# Patient Record
Sex: Female | Born: 1935 | Smoking: Never smoker
Health system: Southern US, Community
[De-identification: ages and names within clinical notes are randomized; demographics above are authoritative.]

## PROBLEM LIST (undated history)

## (undated) DIAGNOSIS — I10 Essential (primary) hypertension: Secondary | ICD-10-CM

## (undated) DIAGNOSIS — I219 Acute myocardial infarction, unspecified: Secondary | ICD-10-CM

## (undated) DIAGNOSIS — E119 Type 2 diabetes mellitus without complications: Secondary | ICD-10-CM

## (undated) DIAGNOSIS — S22080A Wedge compression fracture of T11-T12 vertebra, initial encounter for closed fracture: Secondary | ICD-10-CM

## (undated) DIAGNOSIS — M1710 Unilateral primary osteoarthritis, unspecified knee: Secondary | ICD-10-CM

## (undated) DIAGNOSIS — I272 Pulmonary hypertension, unspecified: Secondary | ICD-10-CM

## (undated) DIAGNOSIS — K219 Gastro-esophageal reflux disease without esophagitis: Secondary | ICD-10-CM

## (undated) DIAGNOSIS — I2510 Atherosclerotic heart disease of native coronary artery without angina pectoris: Secondary | ICD-10-CM

## (undated) DIAGNOSIS — M179 Osteoarthritis of knee, unspecified: Secondary | ICD-10-CM

## (undated) HISTORY — PX: STENT PLACEMENT ILIAC (ARMC HX): HXRAD1735

---

## 2003-11-17 ENCOUNTER — Other Ambulatory Visit: Payer: Self-pay

## 2003-11-18 ENCOUNTER — Other Ambulatory Visit: Payer: Self-pay

## 2004-07-01 ENCOUNTER — Inpatient Hospital Stay: Payer: Self-pay | Admitting: Internal Medicine

## 2006-06-08 ENCOUNTER — Ambulatory Visit: Payer: Self-pay | Admitting: Family Medicine

## 2006-07-05 ENCOUNTER — Ambulatory Visit: Payer: Self-pay | Admitting: Oncology

## 2006-07-10 ENCOUNTER — Ambulatory Visit: Payer: Self-pay | Admitting: Oncology

## 2006-07-20 ENCOUNTER — Ambulatory Visit: Payer: Self-pay | Admitting: Oncology

## 2006-08-09 ENCOUNTER — Ambulatory Visit: Payer: Self-pay | Admitting: Oncology

## 2006-12-10 ENCOUNTER — Ambulatory Visit: Payer: Self-pay | Admitting: Oncology

## 2007-01-02 ENCOUNTER — Ambulatory Visit: Payer: Self-pay | Admitting: Oncology

## 2007-01-09 ENCOUNTER — Ambulatory Visit: Payer: Self-pay | Admitting: Oncology

## 2007-01-10 ENCOUNTER — Ambulatory Visit: Payer: Self-pay | Admitting: Oncology

## 2007-02-09 ENCOUNTER — Ambulatory Visit: Payer: Self-pay | Admitting: Oncology

## 2007-03-11 ENCOUNTER — Ambulatory Visit: Payer: Self-pay | Admitting: Oncology

## 2007-03-21 ENCOUNTER — Ambulatory Visit: Payer: Self-pay | Admitting: Oncology

## 2007-04-11 ENCOUNTER — Ambulatory Visit: Payer: Self-pay | Admitting: Oncology

## 2007-05-12 ENCOUNTER — Ambulatory Visit: Payer: Self-pay | Admitting: Oncology

## 2007-06-09 ENCOUNTER — Ambulatory Visit: Payer: Self-pay | Admitting: Oncology

## 2007-07-10 ENCOUNTER — Ambulatory Visit: Payer: Self-pay | Admitting: Oncology

## 2007-08-09 ENCOUNTER — Ambulatory Visit: Payer: Self-pay | Admitting: Oncology

## 2007-10-09 ENCOUNTER — Ambulatory Visit: Payer: Self-pay | Admitting: Oncology

## 2007-10-28 ENCOUNTER — Ambulatory Visit: Payer: Self-pay | Admitting: Oncology

## 2007-11-09 ENCOUNTER — Ambulatory Visit: Payer: Self-pay | Admitting: Oncology

## 2007-12-10 ENCOUNTER — Ambulatory Visit: Payer: Self-pay | Admitting: Oncology

## 2008-02-09 ENCOUNTER — Ambulatory Visit: Payer: Self-pay | Admitting: Oncology

## 2008-02-24 ENCOUNTER — Ambulatory Visit: Payer: Self-pay | Admitting: Oncology

## 2008-03-10 ENCOUNTER — Ambulatory Visit: Payer: Self-pay | Admitting: Oncology

## 2008-04-10 ENCOUNTER — Ambulatory Visit: Payer: Self-pay | Admitting: Oncology

## 2008-04-21 ENCOUNTER — Ambulatory Visit: Payer: Self-pay | Admitting: Oncology

## 2008-05-11 ENCOUNTER — Ambulatory Visit: Payer: Self-pay | Admitting: Oncology

## 2008-07-09 ENCOUNTER — Ambulatory Visit: Payer: Self-pay | Admitting: Oncology

## 2008-07-14 ENCOUNTER — Ambulatory Visit: Payer: Self-pay | Admitting: Oncology

## 2008-08-08 ENCOUNTER — Ambulatory Visit: Payer: Self-pay

## 2008-08-08 ENCOUNTER — Ambulatory Visit: Payer: Self-pay | Admitting: Oncology

## 2008-10-08 ENCOUNTER — Ambulatory Visit: Payer: Self-pay | Admitting: Oncology

## 2008-10-27 ENCOUNTER — Ambulatory Visit: Payer: Self-pay | Admitting: Oncology

## 2008-11-08 ENCOUNTER — Ambulatory Visit: Payer: Self-pay | Admitting: Oncology

## 2009-06-08 ENCOUNTER — Ambulatory Visit: Payer: Self-pay | Admitting: Oncology

## 2009-06-15 ENCOUNTER — Ambulatory Visit: Payer: Self-pay | Admitting: Oncology

## 2009-06-17 ENCOUNTER — Ambulatory Visit: Payer: Self-pay | Admitting: Internal Medicine

## 2009-07-09 ENCOUNTER — Ambulatory Visit: Payer: Self-pay | Admitting: Oncology

## 2009-07-20 ENCOUNTER — Ambulatory Visit: Payer: Self-pay | Admitting: Oncology

## 2009-08-08 ENCOUNTER — Ambulatory Visit: Payer: Self-pay | Admitting: Oncology

## 2009-12-09 ENCOUNTER — Ambulatory Visit: Payer: Self-pay | Admitting: Oncology

## 2010-01-05 ENCOUNTER — Ambulatory Visit: Payer: Self-pay | Admitting: Oncology

## 2010-01-08 ENCOUNTER — Ambulatory Visit: Payer: Self-pay | Admitting: Oncology

## 2010-02-11 ENCOUNTER — Ambulatory Visit: Payer: Self-pay | Admitting: Oncology

## 2010-03-10 ENCOUNTER — Ambulatory Visit: Payer: Self-pay | Admitting: Oncology

## 2010-04-10 ENCOUNTER — Ambulatory Visit: Payer: Self-pay | Admitting: Oncology

## 2010-07-13 ENCOUNTER — Ambulatory Visit: Payer: Self-pay | Admitting: Oncology

## 2010-08-09 ENCOUNTER — Ambulatory Visit: Payer: Self-pay | Admitting: Oncology

## 2010-09-13 ENCOUNTER — Ambulatory Visit: Payer: Self-pay | Admitting: Oncology

## 2010-10-09 ENCOUNTER — Ambulatory Visit: Payer: Self-pay | Admitting: Oncology

## 2010-10-11 ENCOUNTER — Ambulatory Visit: Payer: Self-pay | Admitting: Oncology

## 2010-10-21 ENCOUNTER — Emergency Department: Payer: Self-pay | Admitting: Unknown Physician Specialty

## 2010-11-09 ENCOUNTER — Ambulatory Visit: Payer: Self-pay | Admitting: Oncology

## 2010-12-28 ENCOUNTER — Ambulatory Visit: Payer: Self-pay | Admitting: Oncology

## 2011-01-09 ENCOUNTER — Ambulatory Visit: Payer: Self-pay | Admitting: Oncology

## 2011-02-09 ENCOUNTER — Ambulatory Visit: Payer: Self-pay | Admitting: Oncology

## 2011-07-05 ENCOUNTER — Ambulatory Visit: Payer: Self-pay | Admitting: Internal Medicine

## 2011-07-05 DIAGNOSIS — M159 Polyosteoarthritis, unspecified: Secondary | ICD-10-CM | POA: Diagnosis not present

## 2011-07-05 DIAGNOSIS — I2510 Atherosclerotic heart disease of native coronary artery without angina pectoris: Secondary | ICD-10-CM | POA: Diagnosis not present

## 2011-07-05 DIAGNOSIS — M2560 Stiffness of unspecified joint, not elsewhere classified: Secondary | ICD-10-CM | POA: Diagnosis not present

## 2011-07-05 DIAGNOSIS — M25519 Pain in unspecified shoulder: Secondary | ICD-10-CM | POA: Diagnosis not present

## 2011-07-05 DIAGNOSIS — D638 Anemia in other chronic diseases classified elsewhere: Secondary | ICD-10-CM | POA: Diagnosis not present

## 2011-07-05 DIAGNOSIS — I10 Essential (primary) hypertension: Secondary | ICD-10-CM | POA: Diagnosis not present

## 2011-07-05 DIAGNOSIS — M25539 Pain in unspecified wrist: Secondary | ICD-10-CM | POA: Diagnosis not present

## 2011-07-05 DIAGNOSIS — M79609 Pain in unspecified limb: Secondary | ICD-10-CM | POA: Diagnosis not present

## 2011-07-05 DIAGNOSIS — E782 Mixed hyperlipidemia: Secondary | ICD-10-CM | POA: Diagnosis not present

## 2011-07-14 ENCOUNTER — Ambulatory Visit: Payer: Self-pay | Admitting: Oncology

## 2011-07-14 DIAGNOSIS — Z95818 Presence of other cardiac implants and grafts: Secondary | ICD-10-CM | POA: Diagnosis not present

## 2011-07-14 DIAGNOSIS — E538 Deficiency of other specified B group vitamins: Secondary | ICD-10-CM | POA: Diagnosis not present

## 2011-07-14 DIAGNOSIS — Z7982 Long term (current) use of aspirin: Secondary | ICD-10-CM | POA: Diagnosis not present

## 2011-07-14 DIAGNOSIS — E119 Type 2 diabetes mellitus without complications: Secondary | ICD-10-CM | POA: Diagnosis not present

## 2011-07-14 DIAGNOSIS — I2510 Atherosclerotic heart disease of native coronary artery without angina pectoris: Secondary | ICD-10-CM | POA: Diagnosis not present

## 2011-07-14 DIAGNOSIS — D509 Iron deficiency anemia, unspecified: Secondary | ICD-10-CM | POA: Diagnosis not present

## 2011-07-14 DIAGNOSIS — R609 Edema, unspecified: Secondary | ICD-10-CM | POA: Diagnosis not present

## 2011-07-14 DIAGNOSIS — I509 Heart failure, unspecified: Secondary | ICD-10-CM | POA: Diagnosis not present

## 2011-07-14 DIAGNOSIS — R5381 Other malaise: Secondary | ICD-10-CM | POA: Diagnosis not present

## 2011-07-14 DIAGNOSIS — R0602 Shortness of breath: Secondary | ICD-10-CM | POA: Diagnosis not present

## 2011-07-14 DIAGNOSIS — Z888 Allergy status to other drugs, medicaments and biological substances status: Secondary | ICD-10-CM | POA: Diagnosis not present

## 2011-07-14 DIAGNOSIS — Z79899 Other long term (current) drug therapy: Secondary | ICD-10-CM | POA: Diagnosis not present

## 2011-07-14 LAB — CBC CANCER CENTER
Basophil #: 0 x10 3/mm (ref 0.0–0.1)
HCT: 35.1 % (ref 35.0–47.0)
MCH: 30.4 pg (ref 26.0–34.0)
Monocyte #: 0.3 x10 3/mm (ref 0.0–0.7)
Neutrophil #: 2.4 x10 3/mm (ref 1.4–6.5)
Platelet: 219 x10 3/mm (ref 150–440)
RDW: 13.7 % (ref 11.5–14.5)
WBC: 3.9 x10 3/mm (ref 3.6–11.0)

## 2011-07-18 DIAGNOSIS — I82409 Acute embolism and thrombosis of unspecified deep veins of unspecified lower extremity: Secondary | ICD-10-CM | POA: Diagnosis not present

## 2011-07-20 DIAGNOSIS — M7530 Calcific tendinitis of unspecified shoulder: Secondary | ICD-10-CM | POA: Diagnosis not present

## 2011-07-20 DIAGNOSIS — L919 Hypertrophic disorder of the skin, unspecified: Secondary | ICD-10-CM | POA: Diagnosis not present

## 2011-07-20 DIAGNOSIS — L909 Atrophic disorder of skin, unspecified: Secondary | ICD-10-CM | POA: Diagnosis not present

## 2011-07-20 DIAGNOSIS — I10 Essential (primary) hypertension: Secondary | ICD-10-CM | POA: Diagnosis not present

## 2011-08-03 ENCOUNTER — Emergency Department: Payer: Self-pay | Admitting: Emergency Medicine

## 2011-08-03 DIAGNOSIS — R197 Diarrhea, unspecified: Secondary | ICD-10-CM | POA: Diagnosis not present

## 2011-08-03 DIAGNOSIS — R110 Nausea: Secondary | ICD-10-CM | POA: Diagnosis not present

## 2011-08-03 DIAGNOSIS — R6889 Other general symptoms and signs: Secondary | ICD-10-CM | POA: Diagnosis not present

## 2011-08-03 DIAGNOSIS — R1110 Vomiting, unspecified: Secondary | ICD-10-CM | POA: Diagnosis not present

## 2011-08-03 DIAGNOSIS — R1031 Right lower quadrant pain: Secondary | ICD-10-CM | POA: Diagnosis not present

## 2011-08-03 LAB — CBC WITH DIFFERENTIAL/PLATELET
Eosinophil #: 0 10*3/uL (ref 0.0–0.7)
Eosinophil %: 0.2 %
HGB: 12.8 g/dL (ref 12.0–16.0)
Lymphocyte #: 0.3 10*3/uL — ABNORMAL LOW (ref 1.0–3.6)
MCH: 30.6 pg (ref 26.0–34.0)
MCV: 94 fL (ref 80–100)
Monocyte #: 0.5 x10 3/mm (ref 0.2–0.9)
Monocyte %: 5.1 %
Neutrophil #: 7.8 10*3/uL — ABNORMAL HIGH (ref 1.4–6.5)
RDW: 14.7 % — ABNORMAL HIGH (ref 11.5–14.5)
WBC: 8.8 10*3/uL (ref 3.6–11.0)

## 2011-08-03 LAB — COMPREHENSIVE METABOLIC PANEL
Anion Gap: 11 (ref 7–16)
Bilirubin,Total: 0.6 mg/dL (ref 0.2–1.0)
Chloride: 101 mmol/L (ref 98–107)
Co2: 25 mmol/L (ref 21–32)
Creatinine: 0.86 mg/dL (ref 0.60–1.30)
Potassium: 4.1 mmol/L (ref 3.5–5.1)
SGPT (ALT): 17 U/L
Sodium: 137 mmol/L (ref 136–145)

## 2011-08-09 ENCOUNTER — Ambulatory Visit: Payer: Self-pay | Admitting: Oncology

## 2011-09-20 ENCOUNTER — Ambulatory Visit: Payer: Self-pay | Admitting: Oncology

## 2011-09-20 DIAGNOSIS — E538 Deficiency of other specified B group vitamins: Secondary | ICD-10-CM | POA: Diagnosis not present

## 2011-10-02 DIAGNOSIS — I2510 Atherosclerotic heart disease of native coronary artery without angina pectoris: Secondary | ICD-10-CM | POA: Diagnosis not present

## 2011-10-02 DIAGNOSIS — E7800 Pure hypercholesterolemia, unspecified: Secondary | ICD-10-CM | POA: Diagnosis not present

## 2011-10-02 DIAGNOSIS — R609 Edema, unspecified: Secondary | ICD-10-CM | POA: Diagnosis not present

## 2011-10-02 DIAGNOSIS — I10 Essential (primary) hypertension: Secondary | ICD-10-CM | POA: Diagnosis not present

## 2011-10-09 ENCOUNTER — Ambulatory Visit: Payer: Self-pay | Admitting: Oncology

## 2011-10-09 DIAGNOSIS — Z7982 Long term (current) use of aspirin: Secondary | ICD-10-CM | POA: Diagnosis not present

## 2011-10-09 DIAGNOSIS — E119 Type 2 diabetes mellitus without complications: Secondary | ICD-10-CM | POA: Diagnosis not present

## 2011-10-09 DIAGNOSIS — I509 Heart failure, unspecified: Secondary | ICD-10-CM | POA: Diagnosis not present

## 2011-10-09 DIAGNOSIS — I2510 Atherosclerotic heart disease of native coronary artery without angina pectoris: Secondary | ICD-10-CM | POA: Diagnosis not present

## 2011-10-09 DIAGNOSIS — D509 Iron deficiency anemia, unspecified: Secondary | ICD-10-CM | POA: Diagnosis not present

## 2011-10-09 DIAGNOSIS — Z7902 Long term (current) use of antithrombotics/antiplatelets: Secondary | ICD-10-CM | POA: Diagnosis not present

## 2011-10-09 DIAGNOSIS — Z95818 Presence of other cardiac implants and grafts: Secondary | ICD-10-CM | POA: Diagnosis not present

## 2011-10-09 DIAGNOSIS — Z79899 Other long term (current) drug therapy: Secondary | ICD-10-CM | POA: Diagnosis not present

## 2011-10-09 DIAGNOSIS — E538 Deficiency of other specified B group vitamins: Secondary | ICD-10-CM | POA: Diagnosis not present

## 2011-10-09 DIAGNOSIS — R609 Edema, unspecified: Secondary | ICD-10-CM | POA: Diagnosis not present

## 2011-11-03 DIAGNOSIS — E538 Deficiency of other specified B group vitamins: Secondary | ICD-10-CM | POA: Diagnosis not present

## 2011-11-03 DIAGNOSIS — D509 Iron deficiency anemia, unspecified: Secondary | ICD-10-CM | POA: Diagnosis not present

## 2011-11-03 LAB — CBC CANCER CENTER
Basophil %: 0.7 %
Eosinophil #: 0.1 x10 3/mm (ref 0.0–0.7)
HCT: 31.6 % — ABNORMAL LOW (ref 35.0–47.0)
HGB: 10.4 g/dL — ABNORMAL LOW (ref 12.0–16.0)
Lymphocyte #: 0.9 x10 3/mm — ABNORMAL LOW (ref 1.0–3.6)
MCH: 31.6 pg (ref 26.0–34.0)
MCV: 96 fL (ref 80–100)
Monocyte #: 0.4 x10 3/mm (ref 0.2–0.9)
Neutrophil %: 58 %
Platelet: 160 x10 3/mm (ref 150–440)
RBC: 3.31 10*6/uL — ABNORMAL LOW (ref 3.80–5.20)
WBC: 3.4 x10 3/mm — ABNORMAL LOW (ref 3.6–11.0)

## 2011-11-09 ENCOUNTER — Ambulatory Visit: Payer: Self-pay | Admitting: Oncology

## 2011-11-25 ENCOUNTER — Ambulatory Visit: Payer: Self-pay | Admitting: Medical

## 2011-11-25 DIAGNOSIS — S20219A Contusion of unspecified front wall of thorax, initial encounter: Secondary | ICD-10-CM | POA: Diagnosis not present

## 2011-11-25 DIAGNOSIS — S5000XA Contusion of unspecified elbow, initial encounter: Secondary | ICD-10-CM | POA: Diagnosis not present

## 2011-11-25 DIAGNOSIS — S61509A Unspecified open wound of unspecified wrist, initial encounter: Secondary | ICD-10-CM | POA: Diagnosis not present

## 2011-11-25 DIAGNOSIS — M25529 Pain in unspecified elbow: Secondary | ICD-10-CM | POA: Diagnosis not present

## 2011-11-25 DIAGNOSIS — M542 Cervicalgia: Secondary | ICD-10-CM | POA: Diagnosis not present

## 2011-11-25 DIAGNOSIS — R51 Headache: Secondary | ICD-10-CM | POA: Diagnosis not present

## 2011-11-25 DIAGNOSIS — Z049 Encounter for examination and observation for unspecified reason: Secondary | ICD-10-CM | POA: Diagnosis not present

## 2011-11-25 DIAGNOSIS — M25519 Pain in unspecified shoulder: Secondary | ICD-10-CM | POA: Diagnosis not present

## 2011-11-25 DIAGNOSIS — S40019A Contusion of unspecified shoulder, initial encounter: Secondary | ICD-10-CM | POA: Diagnosis not present

## 2011-11-25 DIAGNOSIS — S51009A Unspecified open wound of unspecified elbow, initial encounter: Secondary | ICD-10-CM | POA: Diagnosis not present

## 2011-11-25 DIAGNOSIS — S139XXA Sprain of joints and ligaments of unspecified parts of neck, initial encounter: Secondary | ICD-10-CM | POA: Diagnosis not present

## 2011-12-01 ENCOUNTER — Ambulatory Visit: Payer: Self-pay | Admitting: Oncology

## 2011-12-01 DIAGNOSIS — E538 Deficiency of other specified B group vitamins: Secondary | ICD-10-CM | POA: Diagnosis not present

## 2011-12-10 ENCOUNTER — Ambulatory Visit: Payer: Self-pay | Admitting: Oncology

## 2011-12-28 DIAGNOSIS — D518 Other vitamin B12 deficiency anemias: Secondary | ICD-10-CM | POA: Diagnosis not present

## 2011-12-28 DIAGNOSIS — I739 Peripheral vascular disease, unspecified: Secondary | ICD-10-CM | POA: Diagnosis not present

## 2011-12-28 DIAGNOSIS — E119 Type 2 diabetes mellitus without complications: Secondary | ICD-10-CM | POA: Diagnosis not present

## 2011-12-28 DIAGNOSIS — I2510 Atherosclerotic heart disease of native coronary artery without angina pectoris: Secondary | ICD-10-CM | POA: Diagnosis not present

## 2011-12-28 DIAGNOSIS — E559 Vitamin D deficiency, unspecified: Secondary | ICD-10-CM | POA: Diagnosis not present

## 2011-12-28 DIAGNOSIS — I10 Essential (primary) hypertension: Secondary | ICD-10-CM | POA: Diagnosis not present

## 2011-12-28 DIAGNOSIS — E782 Mixed hyperlipidemia: Secondary | ICD-10-CM | POA: Diagnosis not present

## 2011-12-28 DIAGNOSIS — D509 Iron deficiency anemia, unspecified: Secondary | ICD-10-CM | POA: Diagnosis not present

## 2011-12-29 ENCOUNTER — Ambulatory Visit: Payer: Self-pay | Admitting: Oncology

## 2011-12-29 DIAGNOSIS — E538 Deficiency of other specified B group vitamins: Secondary | ICD-10-CM | POA: Diagnosis not present

## 2012-01-09 ENCOUNTER — Ambulatory Visit: Payer: Self-pay | Admitting: Oncology

## 2012-01-09 DIAGNOSIS — D509 Iron deficiency anemia, unspecified: Secondary | ICD-10-CM | POA: Diagnosis not present

## 2012-02-09 ENCOUNTER — Ambulatory Visit: Payer: Self-pay

## 2012-02-09 ENCOUNTER — Ambulatory Visit: Payer: Self-pay | Admitting: Oncology

## 2012-02-09 DIAGNOSIS — E538 Deficiency of other specified B group vitamins: Secondary | ICD-10-CM | POA: Diagnosis not present

## 2012-03-01 DIAGNOSIS — Z23 Encounter for immunization: Secondary | ICD-10-CM | POA: Diagnosis not present

## 2012-03-10 ENCOUNTER — Ambulatory Visit: Payer: Self-pay | Admitting: Oncology

## 2012-04-12 ENCOUNTER — Ambulatory Visit: Payer: Self-pay | Admitting: Oncology

## 2012-04-12 DIAGNOSIS — I509 Heart failure, unspecified: Secondary | ICD-10-CM | POA: Diagnosis not present

## 2012-04-12 DIAGNOSIS — Z79899 Other long term (current) drug therapy: Secondary | ICD-10-CM | POA: Diagnosis not present

## 2012-04-12 DIAGNOSIS — D509 Iron deficiency anemia, unspecified: Secondary | ICD-10-CM | POA: Diagnosis not present

## 2012-04-12 DIAGNOSIS — Z95818 Presence of other cardiac implants and grafts: Secondary | ICD-10-CM | POA: Diagnosis not present

## 2012-04-12 DIAGNOSIS — I2510 Atherosclerotic heart disease of native coronary artery without angina pectoris: Secondary | ICD-10-CM | POA: Diagnosis not present

## 2012-04-12 DIAGNOSIS — E119 Type 2 diabetes mellitus without complications: Secondary | ICD-10-CM | POA: Diagnosis not present

## 2012-04-12 DIAGNOSIS — Z7982 Long term (current) use of aspirin: Secondary | ICD-10-CM | POA: Diagnosis not present

## 2012-04-12 DIAGNOSIS — Z888 Allergy status to other drugs, medicaments and biological substances status: Secondary | ICD-10-CM | POA: Diagnosis not present

## 2012-04-12 DIAGNOSIS — E538 Deficiency of other specified B group vitamins: Secondary | ICD-10-CM | POA: Diagnosis not present

## 2012-04-12 DIAGNOSIS — Z7902 Long term (current) use of antithrombotics/antiplatelets: Secondary | ICD-10-CM | POA: Diagnosis not present

## 2012-04-12 LAB — CBC CANCER CENTER
Eosinophil #: 0.1 x10 3/mm (ref 0.0–0.7)
Eosinophil %: 2.3 %
HGB: 10.5 g/dL — ABNORMAL LOW (ref 12.0–16.0)
Lymphocyte #: 0.9 x10 3/mm — ABNORMAL LOW (ref 1.0–3.6)
MCV: 94 fL (ref 80–100)
Neutrophil #: 1.6 x10 3/mm (ref 1.4–6.5)
RBC: 3.36 10*6/uL — ABNORMAL LOW (ref 3.80–5.20)

## 2012-04-26 DIAGNOSIS — I2510 Atherosclerotic heart disease of native coronary artery without angina pectoris: Secondary | ICD-10-CM | POA: Diagnosis not present

## 2012-04-26 DIAGNOSIS — I509 Heart failure, unspecified: Secondary | ICD-10-CM | POA: Diagnosis not present

## 2012-04-26 DIAGNOSIS — Z888 Allergy status to other drugs, medicaments and biological substances status: Secondary | ICD-10-CM | POA: Diagnosis not present

## 2012-04-26 DIAGNOSIS — D509 Iron deficiency anemia, unspecified: Secondary | ICD-10-CM | POA: Diagnosis not present

## 2012-04-26 DIAGNOSIS — E119 Type 2 diabetes mellitus without complications: Secondary | ICD-10-CM | POA: Diagnosis not present

## 2012-04-26 DIAGNOSIS — E538 Deficiency of other specified B group vitamins: Secondary | ICD-10-CM | POA: Diagnosis not present

## 2012-05-01 DIAGNOSIS — E782 Mixed hyperlipidemia: Secondary | ICD-10-CM | POA: Diagnosis not present

## 2012-05-01 DIAGNOSIS — E119 Type 2 diabetes mellitus without complications: Secondary | ICD-10-CM | POA: Diagnosis not present

## 2012-05-01 DIAGNOSIS — I10 Essential (primary) hypertension: Secondary | ICD-10-CM | POA: Diagnosis not present

## 2012-05-01 DIAGNOSIS — E559 Vitamin D deficiency, unspecified: Secondary | ICD-10-CM | POA: Diagnosis not present

## 2012-05-01 DIAGNOSIS — D509 Iron deficiency anemia, unspecified: Secondary | ICD-10-CM | POA: Diagnosis not present

## 2012-05-01 DIAGNOSIS — I739 Peripheral vascular disease, unspecified: Secondary | ICD-10-CM | POA: Diagnosis not present

## 2012-05-01 DIAGNOSIS — R262 Difficulty in walking, not elsewhere classified: Secondary | ICD-10-CM | POA: Diagnosis not present

## 2012-05-01 DIAGNOSIS — I2510 Atherosclerotic heart disease of native coronary artery without angina pectoris: Secondary | ICD-10-CM | POA: Diagnosis not present

## 2012-05-03 DIAGNOSIS — I509 Heart failure, unspecified: Secondary | ICD-10-CM | POA: Diagnosis not present

## 2012-05-03 DIAGNOSIS — D509 Iron deficiency anemia, unspecified: Secondary | ICD-10-CM | POA: Diagnosis not present

## 2012-05-03 DIAGNOSIS — I2510 Atherosclerotic heart disease of native coronary artery without angina pectoris: Secondary | ICD-10-CM | POA: Diagnosis not present

## 2012-05-03 DIAGNOSIS — E119 Type 2 diabetes mellitus without complications: Secondary | ICD-10-CM | POA: Diagnosis not present

## 2012-05-03 DIAGNOSIS — Z888 Allergy status to other drugs, medicaments and biological substances status: Secondary | ICD-10-CM | POA: Diagnosis not present

## 2012-05-03 DIAGNOSIS — E538 Deficiency of other specified B group vitamins: Secondary | ICD-10-CM | POA: Diagnosis not present

## 2012-05-11 ENCOUNTER — Ambulatory Visit: Payer: Self-pay | Admitting: Oncology

## 2012-06-28 DIAGNOSIS — E559 Vitamin D deficiency, unspecified: Secondary | ICD-10-CM | POA: Diagnosis not present

## 2012-06-28 DIAGNOSIS — E119 Type 2 diabetes mellitus without complications: Secondary | ICD-10-CM | POA: Diagnosis not present

## 2012-06-28 DIAGNOSIS — I10 Essential (primary) hypertension: Secondary | ICD-10-CM | POA: Diagnosis not present

## 2012-06-28 DIAGNOSIS — D649 Anemia, unspecified: Secondary | ICD-10-CM | POA: Diagnosis not present

## 2012-06-28 DIAGNOSIS — E782 Mixed hyperlipidemia: Secondary | ICD-10-CM | POA: Diagnosis not present

## 2012-07-09 DIAGNOSIS — M159 Polyosteoarthritis, unspecified: Secondary | ICD-10-CM | POA: Diagnosis not present

## 2012-07-09 DIAGNOSIS — E559 Vitamin D deficiency, unspecified: Secondary | ICD-10-CM | POA: Diagnosis not present

## 2012-07-09 DIAGNOSIS — I739 Peripheral vascular disease, unspecified: Secondary | ICD-10-CM | POA: Diagnosis not present

## 2012-07-09 DIAGNOSIS — E119 Type 2 diabetes mellitus without complications: Secondary | ICD-10-CM | POA: Diagnosis not present

## 2012-07-09 DIAGNOSIS — I2510 Atherosclerotic heart disease of native coronary artery without angina pectoris: Secondary | ICD-10-CM | POA: Diagnosis not present

## 2012-07-09 DIAGNOSIS — I10 Essential (primary) hypertension: Secondary | ICD-10-CM | POA: Diagnosis not present

## 2012-07-09 DIAGNOSIS — D509 Iron deficiency anemia, unspecified: Secondary | ICD-10-CM | POA: Diagnosis not present

## 2012-07-09 DIAGNOSIS — R262 Difficulty in walking, not elsewhere classified: Secondary | ICD-10-CM | POA: Diagnosis not present

## 2012-07-09 DIAGNOSIS — Z7902 Long term (current) use of antithrombotics/antiplatelets: Secondary | ICD-10-CM | POA: Diagnosis not present

## 2012-07-09 DIAGNOSIS — Z006 Encounter for examination for normal comparison and control in clinical research program: Secondary | ICD-10-CM | POA: Diagnosis not present

## 2012-07-12 ENCOUNTER — Ambulatory Visit: Payer: Self-pay | Admitting: Oncology

## 2012-07-12 DIAGNOSIS — E538 Deficiency of other specified B group vitamins: Secondary | ICD-10-CM | POA: Diagnosis not present

## 2012-07-12 DIAGNOSIS — Z95818 Presence of other cardiac implants and grafts: Secondary | ICD-10-CM | POA: Diagnosis not present

## 2012-07-12 DIAGNOSIS — E119 Type 2 diabetes mellitus without complications: Secondary | ICD-10-CM | POA: Diagnosis not present

## 2012-07-12 DIAGNOSIS — I2510 Atherosclerotic heart disease of native coronary artery without angina pectoris: Secondary | ICD-10-CM | POA: Diagnosis not present

## 2012-07-12 DIAGNOSIS — Z79899 Other long term (current) drug therapy: Secondary | ICD-10-CM | POA: Diagnosis not present

## 2012-07-12 DIAGNOSIS — Z7982 Long term (current) use of aspirin: Secondary | ICD-10-CM | POA: Diagnosis not present

## 2012-07-12 DIAGNOSIS — I509 Heart failure, unspecified: Secondary | ICD-10-CM | POA: Diagnosis not present

## 2012-07-12 DIAGNOSIS — D509 Iron deficiency anemia, unspecified: Secondary | ICD-10-CM | POA: Diagnosis not present

## 2012-07-12 DIAGNOSIS — Z7902 Long term (current) use of antithrombotics/antiplatelets: Secondary | ICD-10-CM | POA: Diagnosis not present

## 2012-07-25 DIAGNOSIS — D638 Anemia in other chronic diseases classified elsewhere: Secondary | ICD-10-CM | POA: Diagnosis not present

## 2012-07-25 DIAGNOSIS — I10 Essential (primary) hypertension: Secondary | ICD-10-CM | POA: Diagnosis not present

## 2012-07-25 DIAGNOSIS — E119 Type 2 diabetes mellitus without complications: Secondary | ICD-10-CM | POA: Diagnosis not present

## 2012-07-25 DIAGNOSIS — E782 Mixed hyperlipidemia: Secondary | ICD-10-CM | POA: Diagnosis not present

## 2012-07-25 DIAGNOSIS — R5381 Other malaise: Secondary | ICD-10-CM | POA: Diagnosis not present

## 2012-07-25 DIAGNOSIS — I2510 Atherosclerotic heart disease of native coronary artery without angina pectoris: Secondary | ICD-10-CM | POA: Diagnosis not present

## 2012-08-08 ENCOUNTER — Ambulatory Visit: Payer: Self-pay | Admitting: Oncology

## 2012-08-09 LAB — CBC CANCER CENTER
HCT: 33.6 % — ABNORMAL LOW (ref 35.0–47.0)
Lymphocyte #: 1 x10 3/mm (ref 1.0–3.6)
Lymphocyte %: 27.9 %
MCH: 31.2 pg (ref 26.0–34.0)
MCHC: 32.6 g/dL (ref 32.0–36.0)
Monocyte %: 10.9 %
Neutrophil #: 2.2 x10 3/mm (ref 1.4–6.5)
RBC: 3.52 10*6/uL — ABNORMAL LOW (ref 3.80–5.20)
RDW: 14.9 % — ABNORMAL HIGH (ref 11.5–14.5)
WBC: 3.7 x10 3/mm (ref 3.6–11.0)

## 2012-08-09 LAB — IRON AND TIBC
Iron Saturation: 23 %
Iron: 80 ug/dL (ref 50–170)
Unbound Iron-Bind.Cap.: 262 ug/dL

## 2012-09-08 ENCOUNTER — Ambulatory Visit: Payer: Self-pay | Admitting: Oncology

## 2012-09-08 ENCOUNTER — Ambulatory Visit: Payer: Self-pay | Admitting: Internal Medicine

## 2012-09-08 DIAGNOSIS — E538 Deficiency of other specified B group vitamins: Secondary | ICD-10-CM | POA: Diagnosis not present

## 2012-10-08 ENCOUNTER — Ambulatory Visit: Payer: Self-pay | Admitting: Oncology

## 2013-02-10 DIAGNOSIS — Z23 Encounter for immunization: Secondary | ICD-10-CM | POA: Diagnosis not present

## 2013-04-25 ENCOUNTER — Ambulatory Visit: Payer: Self-pay | Admitting: Oncology

## 2013-04-25 DIAGNOSIS — D509 Iron deficiency anemia, unspecified: Secondary | ICD-10-CM | POA: Diagnosis not present

## 2013-04-25 DIAGNOSIS — D72819 Decreased white blood cell count, unspecified: Secondary | ICD-10-CM | POA: Diagnosis not present

## 2013-04-25 DIAGNOSIS — I2510 Atherosclerotic heart disease of native coronary artery without angina pectoris: Secondary | ICD-10-CM | POA: Diagnosis not present

## 2013-04-25 DIAGNOSIS — Z7902 Long term (current) use of antithrombotics/antiplatelets: Secondary | ICD-10-CM | POA: Diagnosis not present

## 2013-04-25 DIAGNOSIS — Z7982 Long term (current) use of aspirin: Secondary | ICD-10-CM | POA: Diagnosis not present

## 2013-04-25 DIAGNOSIS — E538 Deficiency of other specified B group vitamins: Secondary | ICD-10-CM | POA: Diagnosis not present

## 2013-04-25 DIAGNOSIS — Z79899 Other long term (current) drug therapy: Secondary | ICD-10-CM | POA: Diagnosis not present

## 2013-04-25 DIAGNOSIS — I509 Heart failure, unspecified: Secondary | ICD-10-CM | POA: Diagnosis not present

## 2013-04-25 DIAGNOSIS — E119 Type 2 diabetes mellitus without complications: Secondary | ICD-10-CM | POA: Diagnosis not present

## 2013-04-25 LAB — IRON AND TIBC
Iron Bind.Cap.(Total): 298 ug/dL (ref 250–450)
Iron Saturation: 25 %
Iron: 74 ug/dL (ref 50–170)
Unbound Iron-Bind.Cap.: 224 ug/dL

## 2013-04-25 LAB — CBC CANCER CENTER
BASOS ABS: 0 x10 3/mm (ref 0.0–0.1)
Basophil %: 0.8 %
EOS ABS: 0.1 x10 3/mm (ref 0.0–0.7)
Eosinophil %: 2.2 %
HCT: 36.9 % (ref 35.0–47.0)
HGB: 12 g/dL (ref 12.0–16.0)
Lymphocyte #: 1.3 x10 3/mm (ref 1.0–3.6)
Lymphocyte %: 28.1 %
MCH: 30.3 pg (ref 26.0–34.0)
MCHC: 32.5 g/dL (ref 32.0–36.0)
MCV: 93 fL (ref 80–100)
Monocyte #: 0.4 x10 3/mm (ref 0.2–0.9)
Monocyte %: 8.9 %
Neutrophil #: 2.7 x10 3/mm (ref 1.4–6.5)
Neutrophil %: 60 %
PLATELETS: 215 x10 3/mm (ref 150–440)
RBC: 3.96 10*6/uL (ref 3.80–5.20)
RDW: 13.5 % (ref 11.5–14.5)
WBC: 4.5 x10 3/mm (ref 3.6–11.0)

## 2013-04-25 LAB — COMPREHENSIVE METABOLIC PANEL
ANION GAP: 5 — AB (ref 7–16)
Albumin: 3.5 g/dL (ref 3.4–5.0)
Alkaline Phosphatase: 75 U/L
BUN: 16 mg/dL (ref 7–18)
Bilirubin,Total: 0.3 mg/dL (ref 0.2–1.0)
Calcium, Total: 9.5 mg/dL (ref 8.5–10.1)
Chloride: 98 mmol/L (ref 98–107)
Co2: 31 mmol/L (ref 21–32)
Creatinine: 1.03 mg/dL (ref 0.60–1.30)
GFR CALC AF AMER: 56 — AB
GFR CALC NON AF AMER: 48 — AB
Glucose: 213 mg/dL — ABNORMAL HIGH (ref 65–99)
Osmolality: 276 (ref 275–301)
POTASSIUM: 4.9 mmol/L (ref 3.5–5.1)
SGOT(AST): 14 U/L — ABNORMAL LOW (ref 15–37)
SGPT (ALT): 17 U/L (ref 12–78)
SODIUM: 134 mmol/L — AB (ref 136–145)
Total Protein: 7.4 g/dL (ref 6.4–8.2)

## 2013-04-25 LAB — FERRITIN: Ferritin (ARMC): 108 ng/mL (ref 8–388)

## 2013-04-30 DIAGNOSIS — IMO0001 Reserved for inherently not codable concepts without codable children: Secondary | ICD-10-CM | POA: Diagnosis not present

## 2013-04-30 DIAGNOSIS — M159 Polyosteoarthritis, unspecified: Secondary | ICD-10-CM | POA: Diagnosis not present

## 2013-04-30 DIAGNOSIS — I2510 Atherosclerotic heart disease of native coronary artery without angina pectoris: Secondary | ICD-10-CM | POA: Diagnosis not present

## 2013-04-30 DIAGNOSIS — M7530 Calcific tendinitis of unspecified shoulder: Secondary | ICD-10-CM | POA: Diagnosis not present

## 2013-04-30 DIAGNOSIS — I10 Essential (primary) hypertension: Secondary | ICD-10-CM | POA: Diagnosis not present

## 2013-04-30 DIAGNOSIS — E2839 Other primary ovarian failure: Secondary | ICD-10-CM | POA: Diagnosis not present

## 2013-04-30 DIAGNOSIS — D638 Anemia in other chronic diseases classified elsewhere: Secondary | ICD-10-CM | POA: Diagnosis not present

## 2013-05-05 DIAGNOSIS — M67919 Unspecified disorder of synovium and tendon, unspecified shoulder: Secondary | ICD-10-CM | POA: Diagnosis not present

## 2013-05-05 DIAGNOSIS — M719 Bursopathy, unspecified: Secondary | ICD-10-CM | POA: Diagnosis not present

## 2013-05-06 DIAGNOSIS — M5030 Other cervical disc degeneration, unspecified cervical region: Secondary | ICD-10-CM | POA: Diagnosis not present

## 2013-05-09 DIAGNOSIS — M67919 Unspecified disorder of synovium and tendon, unspecified shoulder: Secondary | ICD-10-CM | POA: Diagnosis not present

## 2013-05-11 ENCOUNTER — Ambulatory Visit: Payer: Self-pay | Admitting: Oncology

## 2013-05-20 DIAGNOSIS — R262 Difficulty in walking, not elsewhere classified: Secondary | ICD-10-CM | POA: Diagnosis not present

## 2013-05-20 DIAGNOSIS — D509 Iron deficiency anemia, unspecified: Secondary | ICD-10-CM | POA: Diagnosis not present

## 2013-05-20 DIAGNOSIS — IMO0001 Reserved for inherently not codable concepts without codable children: Secondary | ICD-10-CM | POA: Diagnosis not present

## 2013-05-20 DIAGNOSIS — I2510 Atherosclerotic heart disease of native coronary artery without angina pectoris: Secondary | ICD-10-CM | POA: Diagnosis not present

## 2013-05-29 DIAGNOSIS — E119 Type 2 diabetes mellitus without complications: Secondary | ICD-10-CM | POA: Diagnosis not present

## 2013-05-29 DIAGNOSIS — I209 Angina pectoris, unspecified: Secondary | ICD-10-CM | POA: Diagnosis not present

## 2013-05-29 DIAGNOSIS — I2510 Atherosclerotic heart disease of native coronary artery without angina pectoris: Secondary | ICD-10-CM | POA: Diagnosis not present

## 2013-05-29 DIAGNOSIS — E7800 Pure hypercholesterolemia, unspecified: Secondary | ICD-10-CM | POA: Diagnosis not present

## 2013-09-05 ENCOUNTER — Inpatient Hospital Stay: Payer: Self-pay | Admitting: Internal Medicine

## 2013-09-05 DIAGNOSIS — Z7982 Long term (current) use of aspirin: Secondary | ICD-10-CM | POA: Diagnosis not present

## 2013-09-05 DIAGNOSIS — N3000 Acute cystitis without hematuria: Secondary | ICD-10-CM | POA: Diagnosis not present

## 2013-09-05 DIAGNOSIS — E860 Dehydration: Secondary | ICD-10-CM | POA: Diagnosis not present

## 2013-09-05 DIAGNOSIS — R5381 Other malaise: Secondary | ICD-10-CM | POA: Diagnosis present

## 2013-09-05 DIAGNOSIS — R197 Diarrhea, unspecified: Secondary | ICD-10-CM | POA: Diagnosis not present

## 2013-09-05 DIAGNOSIS — J42 Unspecified chronic bronchitis: Secondary | ICD-10-CM | POA: Diagnosis not present

## 2013-09-05 DIAGNOSIS — G9340 Encephalopathy, unspecified: Secondary | ICD-10-CM | POA: Diagnosis not present

## 2013-09-05 DIAGNOSIS — J8410 Pulmonary fibrosis, unspecified: Secondary | ICD-10-CM | POA: Diagnosis not present

## 2013-09-05 DIAGNOSIS — I2510 Atherosclerotic heart disease of native coronary artery without angina pectoris: Secondary | ICD-10-CM | POA: Diagnosis present

## 2013-09-05 DIAGNOSIS — Z9861 Coronary angioplasty status: Secondary | ICD-10-CM | POA: Diagnosis not present

## 2013-09-05 DIAGNOSIS — R1011 Right upper quadrant pain: Secondary | ICD-10-CM | POA: Diagnosis not present

## 2013-09-05 DIAGNOSIS — R4182 Altered mental status, unspecified: Secondary | ICD-10-CM | POA: Diagnosis not present

## 2013-09-05 DIAGNOSIS — I509 Heart failure, unspecified: Secondary | ICD-10-CM | POA: Diagnosis not present

## 2013-09-05 DIAGNOSIS — K560 Paralytic ileus: Secondary | ICD-10-CM | POA: Diagnosis not present

## 2013-09-05 DIAGNOSIS — D638 Anemia in other chronic diseases classified elsewhere: Secondary | ICD-10-CM | POA: Diagnosis present

## 2013-09-05 DIAGNOSIS — E119 Type 2 diabetes mellitus without complications: Secondary | ICD-10-CM | POA: Diagnosis not present

## 2013-09-05 DIAGNOSIS — Z7902 Long term (current) use of antithrombotics/antiplatelets: Secondary | ICD-10-CM | POA: Diagnosis not present

## 2013-09-05 DIAGNOSIS — R5383 Other fatigue: Secondary | ICD-10-CM | POA: Diagnosis not present

## 2013-09-05 DIAGNOSIS — N390 Urinary tract infection, site not specified: Secondary | ICD-10-CM | POA: Diagnosis not present

## 2013-09-05 DIAGNOSIS — G9341 Metabolic encephalopathy: Secondary | ICD-10-CM | POA: Diagnosis present

## 2013-09-05 DIAGNOSIS — R110 Nausea: Secondary | ICD-10-CM | POA: Diagnosis not present

## 2013-09-05 LAB — URINALYSIS, COMPLETE
Bilirubin,UR: NEGATIVE
Glucose,UR: >=500 mg/dL (ref 0–75)
Leukocyte Esterase: NEGATIVE
NITRITE: NEGATIVE
PH: 8 (ref 4.5–8.0)
Protein: NEGATIVE
SPECIFIC GRAVITY: 1.008 (ref 1.003–1.030)
Squamous Epithelial: 1
WBC UR: 7 /HPF (ref 0–5)

## 2013-09-05 LAB — CBC WITH DIFFERENTIAL/PLATELET
Basophil #: 0 10*3/uL (ref 0.0–0.1)
Basophil %: 0.4 %
EOS ABS: 0 10*3/uL (ref 0.0–0.7)
Eosinophil %: 0.1 %
HCT: 37.7 % (ref 35.0–47.0)
HGB: 12.2 g/dL (ref 12.0–16.0)
Lymphocyte #: 0.7 10*3/uL — ABNORMAL LOW (ref 1.0–3.6)
Lymphocyte %: 9.7 %
MCH: 30.3 pg (ref 26.0–34.0)
MCHC: 32.3 g/dL (ref 32.0–36.0)
MCV: 94 fL (ref 80–100)
MONOS PCT: 4 %
Monocyte #: 0.3 x10 3/mm (ref 0.2–0.9)
NEUTROS PCT: 85.8 %
Neutrophil #: 6.3 10*3/uL (ref 1.4–6.5)
PLATELETS: 196 10*3/uL (ref 150–440)
RBC: 4.03 10*6/uL (ref 3.80–5.20)
RDW: 14.8 % — AB (ref 11.5–14.5)
WBC: 7.4 10*3/uL (ref 3.6–11.0)

## 2013-09-05 LAB — COMPREHENSIVE METABOLIC PANEL
ALBUMIN: 3.6 g/dL (ref 3.4–5.0)
ALK PHOS: 58 U/L
ALT: 11 U/L — AB (ref 12–78)
AST: 24 U/L (ref 15–37)
Anion Gap: 7 (ref 7–16)
BUN: 15 mg/dL (ref 7–18)
Bilirubin,Total: 0.3 mg/dL (ref 0.2–1.0)
CALCIUM: 9.2 mg/dL (ref 8.5–10.1)
CREATININE: 0.85 mg/dL (ref 0.60–1.30)
Chloride: 99 mmol/L (ref 98–107)
Co2: 27 mmol/L (ref 21–32)
GLUCOSE: 188 mg/dL — AB (ref 65–99)
OSMOLALITY: 272 (ref 275–301)
POTASSIUM: 4.1 mmol/L (ref 3.5–5.1)
SODIUM: 133 mmol/L — AB (ref 136–145)
Total Protein: 7.8 g/dL (ref 6.4–8.2)

## 2013-09-05 LAB — PROTIME-INR
INR: 1
PROTHROMBIN TIME: 13.1 s (ref 11.5–14.7)

## 2013-09-05 LAB — APTT: Activated PTT: 30.1 secs (ref 23.6–35.9)

## 2013-09-05 LAB — LIPASE, BLOOD: Lipase: 123 U/L (ref 73–393)

## 2013-09-06 LAB — CBC WITH DIFFERENTIAL/PLATELET
BASOS PCT: 0.6 %
Basophil #: 0 10*3/uL (ref 0.0–0.1)
Eosinophil #: 0 10*3/uL (ref 0.0–0.7)
Eosinophil %: 0.2 %
HCT: 30.9 % — AB (ref 35.0–47.0)
HGB: 10.1 g/dL — ABNORMAL LOW (ref 12.0–16.0)
LYMPHS ABS: 0.9 10*3/uL — AB (ref 1.0–3.6)
Lymphocyte %: 16.3 %
MCH: 29.9 pg (ref 26.0–34.0)
MCHC: 32.6 g/dL (ref 32.0–36.0)
MCV: 92 fL (ref 80–100)
MONO ABS: 0.6 x10 3/mm (ref 0.2–0.9)
Monocyte %: 10.8 %
NEUTROS PCT: 72.1 %
Neutrophil #: 3.9 10*3/uL (ref 1.4–6.5)
PLATELETS: 192 10*3/uL (ref 150–440)
RBC: 3.37 10*6/uL — AB (ref 3.80–5.20)
RDW: 14.5 % (ref 11.5–14.5)
WBC: 5.4 10*3/uL (ref 3.6–11.0)

## 2013-09-06 LAB — BASIC METABOLIC PANEL
Anion Gap: 6 — ABNORMAL LOW (ref 7–16)
BUN: 14 mg/dL (ref 7–18)
CALCIUM: 7.8 mg/dL — AB (ref 8.5–10.1)
CHLORIDE: 101 mmol/L (ref 98–107)
Co2: 27 mmol/L (ref 21–32)
Creatinine: 0.96 mg/dL (ref 0.60–1.30)
EGFR (Non-African Amer.): 52 — ABNORMAL LOW
GLUCOSE: 99 mg/dL (ref 65–99)
Osmolality: 269 (ref 275–301)
Potassium: 3.6 mmol/L (ref 3.5–5.1)
SODIUM: 134 mmol/L — AB (ref 136–145)

## 2013-09-07 LAB — CLOSTRIDIUM DIFFICILE(ARMC)

## 2013-09-08 LAB — HEMOGLOBIN A1C: Hemoglobin A1C: 7.8 % — ABNORMAL HIGH (ref 4.2–6.3)

## 2013-09-16 DIAGNOSIS — I10 Essential (primary) hypertension: Secondary | ICD-10-CM | POA: Diagnosis not present

## 2013-09-16 DIAGNOSIS — D638 Anemia in other chronic diseases classified elsewhere: Secondary | ICD-10-CM | POA: Diagnosis not present

## 2013-09-16 DIAGNOSIS — N3020 Other chronic cystitis without hematuria: Secondary | ICD-10-CM | POA: Diagnosis not present

## 2013-09-16 DIAGNOSIS — R262 Difficulty in walking, not elsewhere classified: Secondary | ICD-10-CM | POA: Diagnosis not present

## 2013-09-16 DIAGNOSIS — R300 Dysuria: Secondary | ICD-10-CM | POA: Diagnosis not present

## 2013-09-16 DIAGNOSIS — E119 Type 2 diabetes mellitus without complications: Secondary | ICD-10-CM | POA: Diagnosis not present

## 2013-09-16 DIAGNOSIS — R51 Headache: Secondary | ICD-10-CM | POA: Diagnosis not present

## 2013-09-16 DIAGNOSIS — I2510 Atherosclerotic heart disease of native coronary artery without angina pectoris: Secondary | ICD-10-CM | POA: Diagnosis not present

## 2013-09-18 DIAGNOSIS — I2510 Atherosclerotic heart disease of native coronary artery without angina pectoris: Secondary | ICD-10-CM | POA: Diagnosis not present

## 2013-09-18 DIAGNOSIS — R269 Unspecified abnormalities of gait and mobility: Secondary | ICD-10-CM | POA: Diagnosis not present

## 2013-09-18 DIAGNOSIS — M1990 Unspecified osteoarthritis, unspecified site: Secondary | ICD-10-CM | POA: Diagnosis not present

## 2013-09-18 DIAGNOSIS — E119 Type 2 diabetes mellitus without complications: Secondary | ICD-10-CM | POA: Diagnosis not present

## 2013-09-18 DIAGNOSIS — I10 Essential (primary) hypertension: Secondary | ICD-10-CM | POA: Diagnosis not present

## 2013-09-18 DIAGNOSIS — N390 Urinary tract infection, site not specified: Secondary | ICD-10-CM | POA: Diagnosis not present

## 2013-09-18 DIAGNOSIS — Z9181 History of falling: Secondary | ICD-10-CM | POA: Diagnosis not present

## 2013-09-18 DIAGNOSIS — M6281 Muscle weakness (generalized): Secondary | ICD-10-CM | POA: Diagnosis not present

## 2013-09-19 DIAGNOSIS — I2510 Atherosclerotic heart disease of native coronary artery without angina pectoris: Secondary | ICD-10-CM | POA: Diagnosis not present

## 2013-09-19 DIAGNOSIS — I10 Essential (primary) hypertension: Secondary | ICD-10-CM | POA: Diagnosis not present

## 2013-09-19 DIAGNOSIS — N390 Urinary tract infection, site not specified: Secondary | ICD-10-CM | POA: Diagnosis not present

## 2013-09-19 DIAGNOSIS — M6281 Muscle weakness (generalized): Secondary | ICD-10-CM | POA: Diagnosis not present

## 2013-09-19 DIAGNOSIS — E119 Type 2 diabetes mellitus without complications: Secondary | ICD-10-CM | POA: Diagnosis not present

## 2013-09-19 DIAGNOSIS — R269 Unspecified abnormalities of gait and mobility: Secondary | ICD-10-CM | POA: Diagnosis not present

## 2013-09-23 DIAGNOSIS — M6281 Muscle weakness (generalized): Secondary | ICD-10-CM | POA: Diagnosis not present

## 2013-09-23 DIAGNOSIS — R269 Unspecified abnormalities of gait and mobility: Secondary | ICD-10-CM | POA: Diagnosis not present

## 2013-09-23 DIAGNOSIS — N390 Urinary tract infection, site not specified: Secondary | ICD-10-CM | POA: Diagnosis not present

## 2013-09-23 DIAGNOSIS — I10 Essential (primary) hypertension: Secondary | ICD-10-CM | POA: Diagnosis not present

## 2013-09-23 DIAGNOSIS — E119 Type 2 diabetes mellitus without complications: Secondary | ICD-10-CM | POA: Diagnosis not present

## 2013-09-23 DIAGNOSIS — I2510 Atherosclerotic heart disease of native coronary artery without angina pectoris: Secondary | ICD-10-CM | POA: Diagnosis not present

## 2013-09-25 DIAGNOSIS — M6281 Muscle weakness (generalized): Secondary | ICD-10-CM | POA: Diagnosis not present

## 2013-09-25 DIAGNOSIS — I2510 Atherosclerotic heart disease of native coronary artery without angina pectoris: Secondary | ICD-10-CM | POA: Diagnosis not present

## 2013-09-25 DIAGNOSIS — R269 Unspecified abnormalities of gait and mobility: Secondary | ICD-10-CM | POA: Diagnosis not present

## 2013-09-25 DIAGNOSIS — N390 Urinary tract infection, site not specified: Secondary | ICD-10-CM | POA: Diagnosis not present

## 2013-09-25 DIAGNOSIS — I10 Essential (primary) hypertension: Secondary | ICD-10-CM | POA: Diagnosis not present

## 2013-09-25 DIAGNOSIS — E119 Type 2 diabetes mellitus without complications: Secondary | ICD-10-CM | POA: Diagnosis not present

## 2013-09-26 ENCOUNTER — Ambulatory Visit: Payer: Self-pay | Admitting: Internal Medicine

## 2013-09-26 DIAGNOSIS — R269 Unspecified abnormalities of gait and mobility: Secondary | ICD-10-CM | POA: Diagnosis not present

## 2013-09-26 DIAGNOSIS — M6281 Muscle weakness (generalized): Secondary | ICD-10-CM | POA: Diagnosis not present

## 2013-09-26 DIAGNOSIS — I10 Essential (primary) hypertension: Secondary | ICD-10-CM | POA: Diagnosis not present

## 2013-09-26 DIAGNOSIS — R51 Headache: Secondary | ICD-10-CM | POA: Diagnosis not present

## 2013-09-26 DIAGNOSIS — S0993XA Unspecified injury of face, initial encounter: Secondary | ICD-10-CM | POA: Diagnosis not present

## 2013-09-26 DIAGNOSIS — N390 Urinary tract infection, site not specified: Secondary | ICD-10-CM | POA: Diagnosis not present

## 2013-09-26 DIAGNOSIS — E119 Type 2 diabetes mellitus without complications: Secondary | ICD-10-CM | POA: Diagnosis not present

## 2013-09-26 DIAGNOSIS — I2510 Atherosclerotic heart disease of native coronary artery without angina pectoris: Secondary | ICD-10-CM | POA: Diagnosis not present

## 2013-09-26 DIAGNOSIS — M542 Cervicalgia: Secondary | ICD-10-CM | POA: Diagnosis not present

## 2013-09-26 DIAGNOSIS — M5020 Other cervical disc displacement, unspecified cervical region: Secondary | ICD-10-CM | POA: Diagnosis not present

## 2013-09-29 DIAGNOSIS — Z9181 History of falling: Secondary | ICD-10-CM | POA: Diagnosis not present

## 2013-09-29 DIAGNOSIS — I10 Essential (primary) hypertension: Secondary | ICD-10-CM | POA: Diagnosis not present

## 2013-09-29 DIAGNOSIS — N390 Urinary tract infection, site not specified: Secondary | ICD-10-CM | POA: Diagnosis not present

## 2013-09-29 DIAGNOSIS — E119 Type 2 diabetes mellitus without complications: Secondary | ICD-10-CM | POA: Diagnosis not present

## 2013-09-29 DIAGNOSIS — R269 Unspecified abnormalities of gait and mobility: Secondary | ICD-10-CM | POA: Diagnosis not present

## 2013-09-29 DIAGNOSIS — I2510 Atherosclerotic heart disease of native coronary artery without angina pectoris: Secondary | ICD-10-CM | POA: Diagnosis not present

## 2013-09-29 DIAGNOSIS — M6281 Muscle weakness (generalized): Secondary | ICD-10-CM | POA: Diagnosis not present

## 2013-09-29 DIAGNOSIS — M1990 Unspecified osteoarthritis, unspecified site: Secondary | ICD-10-CM | POA: Diagnosis not present

## 2013-09-30 DIAGNOSIS — M6281 Muscle weakness (generalized): Secondary | ICD-10-CM | POA: Diagnosis not present

## 2013-09-30 DIAGNOSIS — E119 Type 2 diabetes mellitus without complications: Secondary | ICD-10-CM | POA: Diagnosis not present

## 2013-09-30 DIAGNOSIS — I10 Essential (primary) hypertension: Secondary | ICD-10-CM | POA: Diagnosis not present

## 2013-09-30 DIAGNOSIS — I2510 Atherosclerotic heart disease of native coronary artery without angina pectoris: Secondary | ICD-10-CM | POA: Diagnosis not present

## 2013-09-30 DIAGNOSIS — R269 Unspecified abnormalities of gait and mobility: Secondary | ICD-10-CM | POA: Diagnosis not present

## 2013-09-30 DIAGNOSIS — N390 Urinary tract infection, site not specified: Secondary | ICD-10-CM | POA: Diagnosis not present

## 2013-10-01 DIAGNOSIS — I10 Essential (primary) hypertension: Secondary | ICD-10-CM | POA: Diagnosis not present

## 2013-10-01 DIAGNOSIS — N390 Urinary tract infection, site not specified: Secondary | ICD-10-CM | POA: Diagnosis not present

## 2013-10-01 DIAGNOSIS — E119 Type 2 diabetes mellitus without complications: Secondary | ICD-10-CM | POA: Diagnosis not present

## 2013-10-01 DIAGNOSIS — M6281 Muscle weakness (generalized): Secondary | ICD-10-CM | POA: Diagnosis not present

## 2013-10-01 DIAGNOSIS — I2510 Atherosclerotic heart disease of native coronary artery without angina pectoris: Secondary | ICD-10-CM | POA: Diagnosis not present

## 2013-10-01 DIAGNOSIS — R269 Unspecified abnormalities of gait and mobility: Secondary | ICD-10-CM | POA: Diagnosis not present

## 2013-10-02 DIAGNOSIS — M6281 Muscle weakness (generalized): Secondary | ICD-10-CM | POA: Diagnosis not present

## 2013-10-02 DIAGNOSIS — E119 Type 2 diabetes mellitus without complications: Secondary | ICD-10-CM | POA: Diagnosis not present

## 2013-10-02 DIAGNOSIS — I10 Essential (primary) hypertension: Secondary | ICD-10-CM | POA: Diagnosis not present

## 2013-10-02 DIAGNOSIS — N390 Urinary tract infection, site not specified: Secondary | ICD-10-CM | POA: Diagnosis not present

## 2013-10-02 DIAGNOSIS — I2510 Atherosclerotic heart disease of native coronary artery without angina pectoris: Secondary | ICD-10-CM | POA: Diagnosis not present

## 2013-10-02 DIAGNOSIS — R269 Unspecified abnormalities of gait and mobility: Secondary | ICD-10-CM | POA: Diagnosis not present

## 2013-10-03 DIAGNOSIS — E119 Type 2 diabetes mellitus without complications: Secondary | ICD-10-CM | POA: Diagnosis not present

## 2013-10-03 DIAGNOSIS — M6281 Muscle weakness (generalized): Secondary | ICD-10-CM | POA: Diagnosis not present

## 2013-10-03 DIAGNOSIS — I2510 Atherosclerotic heart disease of native coronary artery without angina pectoris: Secondary | ICD-10-CM | POA: Diagnosis not present

## 2013-10-03 DIAGNOSIS — I10 Essential (primary) hypertension: Secondary | ICD-10-CM | POA: Diagnosis not present

## 2013-10-03 DIAGNOSIS — N390 Urinary tract infection, site not specified: Secondary | ICD-10-CM | POA: Diagnosis not present

## 2013-10-03 DIAGNOSIS — R269 Unspecified abnormalities of gait and mobility: Secondary | ICD-10-CM | POA: Diagnosis not present

## 2013-10-07 DIAGNOSIS — I2510 Atherosclerotic heart disease of native coronary artery without angina pectoris: Secondary | ICD-10-CM | POA: Diagnosis not present

## 2013-10-07 DIAGNOSIS — N390 Urinary tract infection, site not specified: Secondary | ICD-10-CM | POA: Diagnosis not present

## 2013-10-07 DIAGNOSIS — E119 Type 2 diabetes mellitus without complications: Secondary | ICD-10-CM | POA: Diagnosis not present

## 2013-10-07 DIAGNOSIS — M6281 Muscle weakness (generalized): Secondary | ICD-10-CM | POA: Diagnosis not present

## 2013-10-07 DIAGNOSIS — R269 Unspecified abnormalities of gait and mobility: Secondary | ICD-10-CM | POA: Diagnosis not present

## 2013-10-07 DIAGNOSIS — I10 Essential (primary) hypertension: Secondary | ICD-10-CM | POA: Diagnosis not present

## 2013-10-28 DIAGNOSIS — D638 Anemia in other chronic diseases classified elsewhere: Secondary | ICD-10-CM | POA: Diagnosis not present

## 2013-10-28 DIAGNOSIS — E119 Type 2 diabetes mellitus without complications: Secondary | ICD-10-CM | POA: Diagnosis not present

## 2013-10-28 DIAGNOSIS — M159 Polyosteoarthritis, unspecified: Secondary | ICD-10-CM | POA: Diagnosis not present

## 2013-10-28 DIAGNOSIS — I2510 Atherosclerotic heart disease of native coronary artery without angina pectoris: Secondary | ICD-10-CM | POA: Diagnosis not present

## 2013-10-28 DIAGNOSIS — M25569 Pain in unspecified knee: Secondary | ICD-10-CM | POA: Diagnosis not present

## 2013-11-11 DIAGNOSIS — M1710 Unilateral primary osteoarthritis, unspecified knee: Secondary | ICD-10-CM | POA: Diagnosis not present

## 2013-11-11 DIAGNOSIS — IMO0002 Reserved for concepts with insufficient information to code with codable children: Secondary | ICD-10-CM | POA: Diagnosis not present

## 2013-11-17 DIAGNOSIS — E119 Type 2 diabetes mellitus without complications: Secondary | ICD-10-CM | POA: Diagnosis not present

## 2013-11-17 DIAGNOSIS — M159 Polyosteoarthritis, unspecified: Secondary | ICD-10-CM | POA: Diagnosis not present

## 2013-11-17 DIAGNOSIS — R300 Dysuria: Secondary | ICD-10-CM | POA: Diagnosis not present

## 2013-11-17 DIAGNOSIS — N390 Urinary tract infection, site not specified: Secondary | ICD-10-CM | POA: Diagnosis not present

## 2013-11-17 DIAGNOSIS — I10 Essential (primary) hypertension: Secondary | ICD-10-CM | POA: Diagnosis not present

## 2013-12-09 DIAGNOSIS — N390 Urinary tract infection, site not specified: Secondary | ICD-10-CM | POA: Diagnosis not present

## 2013-12-09 DIAGNOSIS — R300 Dysuria: Secondary | ICD-10-CM | POA: Diagnosis not present

## 2014-04-16 DIAGNOSIS — M1711 Unilateral primary osteoarthritis, right knee: Secondary | ICD-10-CM | POA: Diagnosis not present

## 2014-04-22 DIAGNOSIS — N390 Urinary tract infection, site not specified: Secondary | ICD-10-CM | POA: Diagnosis not present

## 2014-04-22 DIAGNOSIS — E1165 Type 2 diabetes mellitus with hyperglycemia: Secondary | ICD-10-CM | POA: Diagnosis not present

## 2014-04-22 DIAGNOSIS — M150 Primary generalized (osteo)arthritis: Secondary | ICD-10-CM | POA: Diagnosis not present

## 2014-04-22 DIAGNOSIS — N3021 Other chronic cystitis with hematuria: Secondary | ICD-10-CM | POA: Diagnosis not present

## 2014-05-06 DIAGNOSIS — M1711 Unilateral primary osteoarthritis, right knee: Secondary | ICD-10-CM | POA: Diagnosis not present

## 2014-05-13 DIAGNOSIS — M1711 Unilateral primary osteoarthritis, right knee: Secondary | ICD-10-CM | POA: Diagnosis not present

## 2014-05-20 DIAGNOSIS — M1711 Unilateral primary osteoarthritis, right knee: Secondary | ICD-10-CM | POA: Diagnosis not present

## 2014-05-25 DIAGNOSIS — M7552 Bursitis of left shoulder: Secondary | ICD-10-CM | POA: Diagnosis not present

## 2014-05-25 DIAGNOSIS — M7551 Bursitis of right shoulder: Secondary | ICD-10-CM | POA: Diagnosis not present

## 2014-05-25 DIAGNOSIS — M25512 Pain in left shoulder: Secondary | ICD-10-CM | POA: Diagnosis not present

## 2014-05-25 DIAGNOSIS — M25511 Pain in right shoulder: Secondary | ICD-10-CM | POA: Diagnosis not present

## 2014-05-28 DIAGNOSIS — E119 Type 2 diabetes mellitus without complications: Secondary | ICD-10-CM | POA: Diagnosis not present

## 2014-05-28 DIAGNOSIS — D638 Anemia in other chronic diseases classified elsewhere: Secondary | ICD-10-CM | POA: Diagnosis not present

## 2014-05-28 DIAGNOSIS — N390 Urinary tract infection, site not specified: Secondary | ICD-10-CM | POA: Diagnosis not present

## 2014-08-01 NOTE — H&P (Signed)
PATIENT NAME:  Angela Vargas, Angela Vargas MR#:  [id number] DATE OF BIRTH:  09/03/1921  DATE OF ADMISSION:  09/05/2013  PRIMARY CARE PHYSICIAN: Beverely Risen, MD.   CHIEF COMPLAINT: Confusion.   HISTORY OF PRESENT ILLNESS: Angela Vargas is a 81 year old [place] female with past medical history significant for congestive heart failure, unknown ejection fraction, anemia of chronic disease, coronary artery disease, status post stents and non-insulin-dependent diabetes mellitus, brought in from home secondary to confusion that started early this morning. The patient is very confused, lying in bed now. Most of the history is obtained from patient's family at bedside. According to the family, the patient was fine up until last night. She is usually very active and functional at baseline. No history of any dementia or confusion at baseline. Around 3:00, they found the patient leaning over at the bathroom. They heard a sound and brought her back to bed. She appeared confused but they thought she was sleepy. Again, at 4:00, she was trying to go to the bathroom again, and there was screaming, "get me out." Since then, she has been trying to use the restroom quite a few times and has been confused and decreased responsiveness at times. Not coherent at all, not recognizing family members, so she was brought to the hospital. Laboratories look okay. She even had a belly scan, because there seemed to be some tenderness on exam in the belly initially. However, the CT head and abdomen have been negative. Urine shows minimal infection, so she is being treated for the same at this time.   PAST MEDICAL HISTORY:  1.  Congestive heart failure, unknown ejection fraction.  2.  Diabetes mellitus.  3.  Anemia of chronic disease.  4.  Coronary artery disease, status post stents.   PAST SURGICAL HISTORY:  Cardiac stent placement.   ALLERGIES TO MEDICATIONS: No known drug allergies.   CURRENT HOME MEDICATIONS:  1.  Losartan 100 mg p.o. daily.   2.  Plavix 75 mg p.o. daily.  3.  Aspirin 81 mg p.o. daily.  4.  Lovastatin 20 mg p.o. at bedtime.  5.  Prilosec 20 mg p.o. daily.  6.  Calcium vitamin D 1 tablet p.o. daily.  7.  Pioglitazone 45 mg p.o. daily.  8.  Celexa 10 mg p.o. daily.  9.  Folic acid 1 mg p.o. daily.  10.  Imdur 20 mg p.o. daily.  11.  Metformin 1000 mg p.o. in the morning and 500 mg p.o. at bedtime.   SOCIAL HISTORY: Lives at home with her family. No smoking, alcohol or drug abuse. Walks independently at baseline.   FAMILY HISTORY: Noncontributory at this time.   REVIEW OF SYSTEMS:  Difficult to be obtained secondary to the patient's confusion.   PHYSICAL EXAMINATION:  VITAL SIGNS: Temperature afebrile, pulse 24, respirations 20, blood pressure 185/69, pulse oximetry 91% on room air.   GENERAL: Well-built, well-nourished female sitting in bed, not in any acute distress.   HEENT: Normocephalic, atraumatic. Pupils equal, round, reacting to light. Anicteric sclerae. Extraocular movements intact. Oropharynx clear without erythema, mass or exudates. No facial droop noted.   NECK: Supple. No thyromegaly, JVD or carotid bruits. No lymphadenopathy. Normal range of motion without pain.   LUNGS: Moving air bilaterally. No wheeze or crackles. No use of accessory muscles for breathing.   CARDIOVASCULAR: S1, S2, regular rate and rhythm, 3/6 systolic murmur heard. No rubs, or gallops.   ABDOMEN: Soft. Mild tenderness, generalized. No guarding or rigidity. Normal bowel sounds.   EXTREMITIES:  No pedal edema. No clubbing or cyanosis, 2+ dorsalis pedis pulses palpable bilaterally.   SKIN: No acne, rash or lesions.   LYMPHATICS: No cervical lymphadenopathy.   NEUROLOGIC: Cranial nerves are intact. The patient is able to move all four extremities without any focal deficits, and she is following some commands at this time.   PSYCHOLOGICAL: She is alert, not oriented.  LABORATORY DATA: WBC 7.4, hemoglobin 12.3,  hematocrit 37.7, platelet count 196.   Sodium 133, potassium 4.1, chloride 99, bicarbonate 27, BUN 15, creatinine 0.85, glucose 188 and calcium of 9.2.   ALT 11, AST 24, alkaline phosphatase 58, total bilirubin 0.3 and albumin of 3.6. Lipase 123, INR 1.0. Urinalysis with few WBCs, 1+ bacteria.   ABG showing pH of 7.45, pCO2 37, pO2 68, bicarbonate of 25.7 and saturation of 95% on room air.   CT of the abdomen and pelvis showing atrophy with periventricular small vessel disease and a mass hemorrhage or acute infarct noted. CT of the abdomen and pelvis with contrast is showing borderline gallbladder distention and minimal common bile duct dilatation. Bladder wall irregularity suggesting cystitis. Dilated distal small bowel loops, which measure normal, could be adynamic ileus. Abdominal ultrasound showing prominent bile duct, although within normal limits for the patient's age. No focal abnormality noted. EKG showing normal sinus rhythm. No acute ST-T wave abnormality. Heart rate of 80.    ASSESSMENT AND PLAN: This is a 81 year old female with history of congestive heart failure, coronary artery disease, diabetes, who is active and functional at baseline brought in for confusion and increased frequency of urination.  1.  Altered mental status, likely metabolic encephalopathy. Could be from urinary tract infection. No focal neurological deficits. CT head is negative. Continue neuro checks, give fluids and antibiotics, and if no improvement by tomorrow, might consider an MRI.  2.  Urinary tract infection with cystitis noted on CT abdomen. Continue IV antibiotics.  3.  Coronary artery disease, stable. Continue medical management. 4.  Congestive heart failure, unknown ejection fraction. Not fluid overloaded and does not take any diuretics at home. Monitor cautiously, especially with gentle hydration.  5.  Diabetes mellitus. Continue home medications.  6.  Deep venous thrombosis prophylaxis with Lovenox. 7.   CODE STATUS: Full code.   Time spent on admission: 50 minutes.    ____________________________ Enid Baasadhika Maanya Hippert, MD rk:cs D: 09/05/2013 18:40:15 ET T: 09/05/2013 20:07:44 ET JOB#: [id number]  cc: Enid Baasadhika Sweetie Giebler, MD, <Dictator> Lyndon CodeFozia M. Khan, MD  Enid BaasADHIKA Jasamine Pottinger MD ELECTRONICALLY SIGNED 09/20/2013 14:07

## 2014-08-01 NOTE — Discharge Summary (Signed)
PATIENT NAME:  Angela Vargas, Angela B MR#:  [id number] DATE OF BIRTH:  1921/04/29  DATE OF ADMISSION:  09/05/2013 DATE OF DISCHARGE:  09/08/2013  PRESENTING COMPLAINT: Altered mental status.   DISCHARGE DIAGNOSES: 1.  Acute encephalopathy/altered mental status, suspected due to urinary tract infection, improved.  2.  Acute encephalopathy, resolved.  3.  Type 2 diabetes.  4.  Generalized weakness.   CODE STATUS: FULL.  DISCHARGE MEDICATIONS: 1.  Folic acid 1 mg p.o. daily.  2.  Pioglitazone 45 mg p.o. daily.  3.  Aspirin 81 mg daily.  4.  Lovastatin 20 mg at bedtime.  5.  Metformin 500 mg 2 tablets in the morning and 1 in the evening.  6.  Plavix 75 mg daily.  7.  Imdur 20 mg daily. 8.  Omeprazole 20 mg daily.  9.  Citalopram 10 mg at bedtime.  10.  Losartan 100 mg daily.  11.  Multivitamin with minerals p.o. daily.  12.  Ceftin 500 mg b.i.d.   DISCHARGE INSTRUCTIONS: 1.  Home health PT. 2.  Low-sodium, carbohydrate-controlled diet.  3.  Glucerna 2 times a day.  4.  Follow up with Dr. Beverely RisenFozia Khan in 1 to 2 weeks.  5.  The patient recommended to use walker.  6.  Home PT has been arranged.  DIAGNOSTIC DATA: Urine positive for UTI.   C. diff was negative.   Chest x-ray: No active disease. The patient has chronic bilateral bronchitic changes. There is left upper lobe granuloma. Stable cardiomegaly.   White count is 5.4, hemoglobin and hematocrit 10.1 and 30.9. Basic metabolic panel within normal limits. Hemoglobin A1c is 7.8.   Ultrasound of the abdomen showed prominent common bile duct, although within normal limits given the patient's age.   CT of the abdomen and pelvis with contrast showed borderline gallbladder distention with minimal common bile duct dilatation. No calcified stone or evidence of choledocholithiasis. Bladder wall irregularity suggesting cystitis. No other explanation of abdominal pain.   CT of the head showed atrophy with periventricular small vessel disease.  No mass, hemorrhage, or acute-appearing infarct.   White count on admission was 7.4. Lipase was 123. PT and INR 13.1 and 1. UA positive for UTI.   EKG: Normal sinus rhythm.  BRIEF SUMMARY OF HOSPITAL COURSE: Angela Vargas is a 81 year old female with past medical history of CHF, CAD status post stent, and diabetes who is brought in for confusion and increased frequency of urination. She was admitted with:  1.  Acute encephalopathy/altered mental status, suspected from acute cystitis/ UTI. No focal neuro deficit. CT head was negative. The patient was started on IV fluids, IV Rocephin. No fever. White count was normal. Mentation was back to normal at discharge. She is to complete a course of antibiotics with Ceftin at home.  2.  CAD. Remained stable.  3.  History of CHF. No fluid overload. Sats stable on room air.  4.  Type 2 diabetes. The patient will continue her home meds. Her sugars were fairly stable. Hemoglobin A1c is 7.8.   Physical therapy saw the patient and recommended home PT, which has been arranged.  Hospital stay otherwise remained stable. The patient remained a FULL code.  TIME SPENT ON DISCHARGE: 40 minutes.   ____________________________ Wylie HailSona A. Allena KatzPatel, MD sap:sb D: 09/09/2013 06:47:03 ET T: 09/09/2013 07:02:15 ET JOB#: [id number]  cc: Angela Mikolajczak A. Allena KatzPatel, MD, <Dictator> Lyndon CodeFozia M. Khan, MD Willow OraSONA A Duckett MD ELECTRONICALLY SIGNED 09/22/2013 13:19

## 2014-10-04 DIAGNOSIS — I252 Old myocardial infarction: Secondary | ICD-10-CM | POA: Diagnosis not present

## 2014-10-04 DIAGNOSIS — E119 Type 2 diabetes mellitus without complications: Secondary | ICD-10-CM | POA: Diagnosis not present

## 2014-10-04 DIAGNOSIS — D649 Anemia, unspecified: Secondary | ICD-10-CM | POA: Diagnosis not present

## 2014-10-04 DIAGNOSIS — I10 Essential (primary) hypertension: Secondary | ICD-10-CM | POA: Diagnosis not present

## 2014-10-04 DIAGNOSIS — M5134 Other intervertebral disc degeneration, thoracic region: Secondary | ICD-10-CM | POA: Diagnosis not present

## 2014-10-04 DIAGNOSIS — R0602 Shortness of breath: Secondary | ICD-10-CM | POA: Diagnosis not present

## 2014-10-04 DIAGNOSIS — R079 Chest pain, unspecified: Secondary | ICD-10-CM | POA: Diagnosis not present

## 2014-10-04 DIAGNOSIS — E871 Hypo-osmolality and hyponatremia: Secondary | ICD-10-CM | POA: Diagnosis not present

## 2014-10-04 DIAGNOSIS — J189 Pneumonia, unspecified organism: Secondary | ICD-10-CM | POA: Diagnosis not present

## 2014-10-04 DIAGNOSIS — R0789 Other chest pain: Secondary | ICD-10-CM | POA: Diagnosis not present

## 2014-10-09 DIAGNOSIS — J189 Pneumonia, unspecified organism: Secondary | ICD-10-CM | POA: Diagnosis not present

## 2014-10-09 DIAGNOSIS — I10 Essential (primary) hypertension: Secondary | ICD-10-CM | POA: Diagnosis not present

## 2014-10-09 DIAGNOSIS — E119 Type 2 diabetes mellitus without complications: Secondary | ICD-10-CM | POA: Diagnosis not present

## 2014-11-04 DIAGNOSIS — E1165 Type 2 diabetes mellitus with hyperglycemia: Secondary | ICD-10-CM | POA: Diagnosis not present

## 2014-11-04 DIAGNOSIS — J189 Pneumonia, unspecified organism: Secondary | ICD-10-CM | POA: Diagnosis not present

## 2014-11-04 DIAGNOSIS — M159 Polyosteoarthritis, unspecified: Secondary | ICD-10-CM | POA: Diagnosis not present

## 2014-11-04 DIAGNOSIS — N3021 Other chronic cystitis with hematuria: Secondary | ICD-10-CM | POA: Diagnosis not present

## 2014-11-04 DIAGNOSIS — D638 Anemia in other chronic diseases classified elsewhere: Secondary | ICD-10-CM | POA: Diagnosis not present

## 2014-11-04 DIAGNOSIS — I2510 Atherosclerotic heart disease of native coronary artery without angina pectoris: Secondary | ICD-10-CM | POA: Diagnosis not present

## 2014-11-05 ENCOUNTER — Other Ambulatory Visit: Payer: Self-pay | Admitting: Physician Assistant

## 2014-11-05 DIAGNOSIS — I2510 Atherosclerotic heart disease of native coronary artery without angina pectoris: Secondary | ICD-10-CM | POA: Diagnosis not present

## 2014-11-05 DIAGNOSIS — E780 Pure hypercholesterolemia: Secondary | ICD-10-CM | POA: Diagnosis not present

## 2014-11-05 DIAGNOSIS — I2584 Coronary atherosclerosis due to calcified coronary lesion: Secondary | ICD-10-CM | POA: Diagnosis not present

## 2014-11-05 DIAGNOSIS — R0602 Shortness of breath: Secondary | ICD-10-CM

## 2014-11-05 DIAGNOSIS — Z955 Presence of coronary angioplasty implant and graft: Secondary | ICD-10-CM | POA: Diagnosis not present

## 2014-11-05 DIAGNOSIS — I209 Angina pectoris, unspecified: Secondary | ICD-10-CM | POA: Diagnosis not present

## 2014-11-06 ENCOUNTER — Ambulatory Visit
Admission: RE | Admit: 2014-11-06 | Discharge: 2014-11-06 | Disposition: A | Discharge status: Home / Self Care | Payer: Medicare Other | Source: Ambulatory Visit | Attending: Physician Assistant | Admitting: Physician Assistant

## 2014-11-06 DIAGNOSIS — I7090 Unspecified atherosclerosis: Secondary | ICD-10-CM | POA: Insufficient documentation

## 2014-11-06 DIAGNOSIS — R0602 Shortness of breath: Secondary | ICD-10-CM | POA: Diagnosis not present

## 2014-11-06 DIAGNOSIS — Z955 Presence of coronary angioplasty implant and graft: Secondary | ICD-10-CM | POA: Insufficient documentation

## 2014-11-06 DIAGNOSIS — N649 Disorder of breast, unspecified: Secondary | ICD-10-CM | POA: Diagnosis not present

## 2014-11-06 DIAGNOSIS — E1165 Type 2 diabetes mellitus with hyperglycemia: Secondary | ICD-10-CM | POA: Diagnosis not present

## 2014-11-06 DIAGNOSIS — E538 Deficiency of other specified B group vitamins: Secondary | ICD-10-CM | POA: Diagnosis not present

## 2014-11-06 DIAGNOSIS — T8069XA Other serum reaction due to other serum, initial encounter: Secondary | ICD-10-CM | POA: Diagnosis not present

## 2014-12-24 DIAGNOSIS — I10 Essential (primary) hypertension: Secondary | ICD-10-CM | POA: Diagnosis not present

## 2014-12-24 DIAGNOSIS — D638 Anemia in other chronic diseases classified elsewhere: Secondary | ICD-10-CM | POA: Diagnosis not present

## 2014-12-24 DIAGNOSIS — N390 Urinary tract infection, site not specified: Secondary | ICD-10-CM | POA: Diagnosis not present

## 2014-12-24 DIAGNOSIS — E1165 Type 2 diabetes mellitus with hyperglycemia: Secondary | ICD-10-CM | POA: Diagnosis not present

## 2014-12-24 DIAGNOSIS — I2510 Atherosclerotic heart disease of native coronary artery without angina pectoris: Secondary | ICD-10-CM | POA: Diagnosis not present

## 2014-12-24 DIAGNOSIS — M150 Primary generalized (osteo)arthritis: Secondary | ICD-10-CM | POA: Diagnosis not present

## 2015-03-10 DIAGNOSIS — I2510 Atherosclerotic heart disease of native coronary artery without angina pectoris: Secondary | ICD-10-CM | POA: Diagnosis not present

## 2015-03-10 DIAGNOSIS — G43A0 Cyclical vomiting, not intractable: Secondary | ICD-10-CM | POA: Diagnosis not present

## 2015-03-10 DIAGNOSIS — Z955 Presence of coronary angioplasty implant and graft: Secondary | ICD-10-CM | POA: Diagnosis not present

## 2015-03-10 DIAGNOSIS — I209 Angina pectoris, unspecified: Secondary | ICD-10-CM | POA: Diagnosis not present

## 2015-04-08 DIAGNOSIS — M159 Polyosteoarthritis, unspecified: Secondary | ICD-10-CM | POA: Diagnosis not present

## 2015-04-08 DIAGNOSIS — E1165 Type 2 diabetes mellitus with hyperglycemia: Secondary | ICD-10-CM | POA: Diagnosis not present

## 2015-04-08 DIAGNOSIS — D638 Anemia in other chronic diseases classified elsewhere: Secondary | ICD-10-CM | POA: Diagnosis not present

## 2015-04-08 DIAGNOSIS — E782 Mixed hyperlipidemia: Secondary | ICD-10-CM | POA: Diagnosis not present

## 2015-04-08 DIAGNOSIS — Z0001 Encounter for general adult medical examination with abnormal findings: Secondary | ICD-10-CM | POA: Diagnosis not present

## 2015-04-08 DIAGNOSIS — I10 Essential (primary) hypertension: Secondary | ICD-10-CM | POA: Diagnosis not present

## 2015-04-08 DIAGNOSIS — I2510 Atherosclerotic heart disease of native coronary artery without angina pectoris: Secondary | ICD-10-CM | POA: Diagnosis not present

## 2015-04-08 DIAGNOSIS — N3021 Other chronic cystitis with hematuria: Secondary | ICD-10-CM | POA: Diagnosis not present

## 2015-04-08 DIAGNOSIS — E1142 Type 2 diabetes mellitus with diabetic polyneuropathy: Secondary | ICD-10-CM | POA: Diagnosis not present

## 2015-04-09 DIAGNOSIS — Z23 Encounter for immunization: Secondary | ICD-10-CM | POA: Diagnosis not present

## 2015-06-11 DIAGNOSIS — N390 Urinary tract infection, site not specified: Secondary | ICD-10-CM | POA: Diagnosis not present

## 2015-06-11 DIAGNOSIS — E119 Type 2 diabetes mellitus without complications: Secondary | ICD-10-CM | POA: Diagnosis not present

## 2015-06-11 DIAGNOSIS — D638 Anemia in other chronic diseases classified elsewhere: Secondary | ICD-10-CM | POA: Diagnosis not present

## 2015-06-11 DIAGNOSIS — R05 Cough: Secondary | ICD-10-CM | POA: Diagnosis not present

## 2015-06-14 DIAGNOSIS — I2510 Atherosclerotic heart disease of native coronary artery without angina pectoris: Secondary | ICD-10-CM | POA: Diagnosis not present

## 2015-06-14 DIAGNOSIS — I10 Essential (primary) hypertension: Secondary | ICD-10-CM | POA: Diagnosis not present

## 2015-06-14 DIAGNOSIS — Z7901 Long term (current) use of anticoagulants: Secondary | ICD-10-CM | POA: Diagnosis not present

## 2015-06-14 DIAGNOSIS — Z87440 Personal history of urinary (tract) infections: Secondary | ICD-10-CM | POA: Diagnosis not present

## 2015-06-14 DIAGNOSIS — R2689 Other abnormalities of gait and mobility: Secondary | ICD-10-CM | POA: Diagnosis not present

## 2015-06-14 DIAGNOSIS — Z7984 Long term (current) use of oral hypoglycemic drugs: Secondary | ICD-10-CM | POA: Diagnosis not present

## 2015-06-14 DIAGNOSIS — I739 Peripheral vascular disease, unspecified: Secondary | ICD-10-CM | POA: Diagnosis not present

## 2015-06-14 DIAGNOSIS — E1142 Type 2 diabetes mellitus with diabetic polyneuropathy: Secondary | ICD-10-CM | POA: Diagnosis not present

## 2015-06-14 DIAGNOSIS — M6281 Muscle weakness (generalized): Secondary | ICD-10-CM | POA: Diagnosis not present

## 2015-06-14 DIAGNOSIS — I6529 Occlusion and stenosis of unspecified carotid artery: Secondary | ICD-10-CM | POA: Diagnosis not present

## 2015-06-14 DIAGNOSIS — M150 Primary generalized (osteo)arthritis: Secondary | ICD-10-CM | POA: Diagnosis not present

## 2015-06-14 DIAGNOSIS — F329 Major depressive disorder, single episode, unspecified: Secondary | ICD-10-CM | POA: Diagnosis not present

## 2015-06-15 ENCOUNTER — Emergency Department: Payer: Medicare Other

## 2015-06-15 ENCOUNTER — Encounter: Payer: Self-pay | Admitting: Medical Oncology

## 2015-06-15 ENCOUNTER — Emergency Department
Admission: EM | Admit: 2015-06-15 | Discharge: 2015-06-15 | Disposition: A | Discharge status: Home / Self Care | Payer: Medicare Other | Attending: Emergency Medicine | Admitting: Emergency Medicine

## 2015-06-15 DIAGNOSIS — R531 Weakness: Secondary | ICD-10-CM | POA: Insufficient documentation

## 2015-06-15 DIAGNOSIS — R112 Nausea with vomiting, unspecified: Secondary | ICD-10-CM | POA: Diagnosis not present

## 2015-06-15 DIAGNOSIS — R05 Cough: Secondary | ICD-10-CM | POA: Diagnosis not present

## 2015-06-15 DIAGNOSIS — A0811 Acute gastroenteropathy due to Norwalk agent: Secondary | ICD-10-CM | POA: Diagnosis not present

## 2015-06-15 DIAGNOSIS — I10 Essential (primary) hypertension: Secondary | ICD-10-CM | POA: Insufficient documentation

## 2015-06-15 DIAGNOSIS — B9789 Other viral agents as the cause of diseases classified elsewhere: Secondary | ICD-10-CM | POA: Insufficient documentation

## 2015-06-15 DIAGNOSIS — R509 Fever, unspecified: Secondary | ICD-10-CM | POA: Diagnosis not present

## 2015-06-15 DIAGNOSIS — R197 Diarrhea, unspecified: Secondary | ICD-10-CM | POA: Diagnosis not present

## 2015-06-15 DIAGNOSIS — E119 Type 2 diabetes mellitus without complications: Secondary | ICD-10-CM | POA: Diagnosis not present

## 2015-06-15 DIAGNOSIS — R0981 Nasal congestion: Secondary | ICD-10-CM | POA: Insufficient documentation

## 2015-06-15 HISTORY — DX: Essential (primary) hypertension: I10

## 2015-06-15 HISTORY — DX: Type 2 diabetes mellitus without complications: E11.9

## 2015-06-15 HISTORY — DX: Acute myocardial infarction, unspecified: I21.9

## 2015-06-15 LAB — URINALYSIS COMPLETE WITH MICROSCOPIC (ARMC ONLY)
Bilirubin Urine: NEGATIVE
GLUCOSE, UA: NEGATIVE mg/dL
Hgb urine dipstick: NEGATIVE
Leukocytes, UA: NEGATIVE
NITRITE: NEGATIVE
Protein, ur: NEGATIVE mg/dL
RBC / HPF: NONE SEEN RBC/hpf (ref 0–5)
SPECIFIC GRAVITY, URINE: 1.015 (ref 1.005–1.030)
pH: 9 — ABNORMAL HIGH (ref 5.0–8.0)

## 2015-06-15 LAB — CBC
HEMATOCRIT: 35.4 % (ref 35.0–47.0)
Hemoglobin: 11.6 g/dL — ABNORMAL LOW (ref 12.0–16.0)
MCH: 29.1 pg (ref 26.0–34.0)
MCHC: 32.9 g/dL (ref 32.0–36.0)
MCV: 88.4 fL (ref 80.0–100.0)
PLATELETS: 226 10*3/uL (ref 150–440)
RBC: 4.01 MIL/uL (ref 3.80–5.20)
RDW: 16.7 % — AB (ref 11.5–14.5)
WBC: 6.2 10*3/uL (ref 3.6–11.0)

## 2015-06-15 LAB — COMPREHENSIVE METABOLIC PANEL
ALBUMIN: 3.5 g/dL (ref 3.5–5.0)
ALK PHOS: 84 U/L (ref 38–126)
ALT: 8 U/L — AB (ref 14–54)
AST: 20 U/L (ref 15–41)
Anion gap: 9 (ref 5–15)
BILIRUBIN TOTAL: 0.7 mg/dL (ref 0.3–1.2)
BUN: 11 mg/dL (ref 6–20)
CO2: 23 mmol/L (ref 22–32)
CREATININE: 1.01 mg/dL — AB (ref 0.44–1.00)
Calcium: 9 mg/dL (ref 8.9–10.3)
Chloride: 99 mmol/L — ABNORMAL LOW (ref 101–111)
GFR calc Af Amer: 55 mL/min — ABNORMAL LOW (ref >60)
GFR calc non Af Amer: 47 mL/min — ABNORMAL LOW (ref >60)
GLUCOSE: 135 mg/dL — AB (ref 65–99)
POTASSIUM: 4.6 mmol/L (ref 3.5–5.1)
Sodium: 131 mmol/L — ABNORMAL LOW (ref 135–145)
TOTAL PROTEIN: 7.2 g/dL (ref 6.5–8.1)

## 2015-06-15 LAB — LIPASE, BLOOD: Lipase: 21 U/L (ref 11–51)

## 2015-06-15 LAB — TROPONIN I

## 2015-06-15 MED ORDER — LOPERAMIDE HCL 2 MG PO CAPS
4.0000 mg | ORAL_CAPSULE | Freq: Once | ORAL | Status: AC
  Administered 2015-06-15: 4 mg via ORAL
  Filled 2015-06-15: qty 2

## 2015-06-15 MED ORDER — ONDANSETRON HCL 4 MG/2ML IJ SOLN
4.0000 mg | Freq: Once | INTRAMUSCULAR | Status: AC
  Administered 2015-06-15: 4 mg via INTRAVENOUS
  Filled 2015-06-15: qty 2

## 2015-06-15 MED ORDER — ONDANSETRON 4 MG PO TBDP: 4.0000 mg | ORAL_TABLET | Freq: Three times a day (TID) | ORAL | Status: AC | PRN

## 2015-06-15 MED ORDER — SODIUM CHLORIDE 0.9 % IV SOLN
Freq: Once | INTRAVENOUS | Status: AC
  Administered 2015-06-15: 13:00:00 via INTRAVENOUS

## 2015-06-15 NOTE — Discharge Instructions (Signed)

## 2015-06-15 NOTE — ED Notes (Signed)
Pt began yesterday with nausea, vomiting and diarrhea yesterday. Pt began running fever off and on 3 weeks ago after visiting in [place] on 2/27.

## 2015-06-15 NOTE — ED Provider Notes (Signed)
[facility] Emergency Department Provider Note     Time seen: ----------------------------------------- 11:16 AM on 06/15/2015 -----------------------------------------    I have reviewed the triage vital signs and the nursing notes.   HISTORY  Chief Complaint Emesis; Diarrhea; and Fever    HPI Angela Vargas is a 82 y.o. female who presents ER for nausea vomiting and diarrhea since yesterday. Family states she has had a fever on and off for the last 3 weeks and has had cough and congestion since visiting [place] at the end of February. Patient states she is weaker than normal, nothing makes her symptoms better. They have given her Tylenol for the fever. She does have history of diabetes and high blood pressure    Past Medical History  Diagnosis Date  . Diabetes mellitus without complication (HCC)   . Myocardial infarct (HCC)   . Hypertension     There are no active problems to display for this patient.   Past Surgical History  Procedure Laterality Date  . Stent placement iliac ([facility] hx)      Allergies Review of patient's allergies indicates no known allergies.  Social History Social History  Substance Use Topics  . Smoking status: Never Smoker   . Smokeless tobacco: None  . Alcohol Use: None    Review of Systems Constitutional: Positive for fever Eyes: Negative for visual changes. ENT: Negative for sore throat. Cardiovascular: Negative for chest pain. Respiratory: Negative for shortness of breath. Gastrointestinal: Negative for abdominal pain, positive for vomiting and diarrhea Genitourinary: Negative for dysuria. Musculoskeletal: Negative for back pain. Skin: Negative for rash. Neurological: Negative for headaches, positive for weakness  10-point ROS otherwise negative.  ____________________________________________   PHYSICAL EXAM:  VITAL SIGNS: ED Triage Vitals  Enc Vitals Group     BP 06/15/15 1104 133/61 mmHg   Pulse Rate 06/15/15 1104 101     Resp 06/15/15 1104 20     Temp 06/15/15 1104 98.3 F (36.8 C)     Temp Source 06/15/15 1104 Oral     SpO2 06/15/15 1104 97 %     Weight 06/15/15 1104 127 lb (57.607 kg)     Height 06/15/15 1104 5' (1.524 m)     Head Cir --      Peak Flow --      Pain Score --      Pain Loc --      Pain Edu? --      Excl. in GC? --     Constitutional: Alert and oriented. Weak appearing, no acute distress Eyes: Conjunctivae are pale. PERRL. Normal extraocular movements. ENT   Head: Normocephalic and atraumatic.   Nose: No congestion/rhinnorhea.   Mouth/Throat: Mucous membranes are moist.   Neck: No stridor. Cardiovascular: Normal rate, regular rhythm. Normal and symmetric distal pulses are present in all extremities. No murmurs, rubs, or gallops. Respiratory: Normal respiratory effort without tachypnea nor retractions. Breath sounds are clear and equal bilaterally. No wheezes/rales/rhonchi. Gastrointestinal: Soft and nontender. No distention. No abdominal bruits.  Musculoskeletal: Nontender with normal range of motion in all extremities.  Neurologic:  Normal speech and language. No gross focal neurologic deficits are appreciated. Generalized weakness Skin:  Skin is warm, dry and intact. Pallor is noted Psychiatric: Mood and affect are normal. Speech and behavior are normal. Patient exhibits appropriate insight and judgment. ____________________________________________  ED COURSE:  Pertinent labs & imaging results that were available during my care of the patient were reviewed by me and considered in my medical decision  making (see chart for details). Patient with weakness, nausea vomiting and diarrhea. Likely neurovirus and dehydration. She'll receive IV fluids antiemetics. I will also check a chest x-ray. ____________________________________________    LABS (pertinent positives/negatives)  Labs Reviewed  COMPREHENSIVE METABOLIC PANEL - Abnormal;  Notable for the following:    Sodium 131 (*)    Chloride 99 (*)    Glucose, Bld 135 (*)    Creatinine, Ser 1.01 (*)    ALT 8 (*)    GFR calc non Af Amer 47 (*)    GFR calc Af Amer 55 (*)    All other components within normal limits  CBC - Abnormal; Notable for the following:    Hemoglobin 11.6 (*)    RDW 16.7 (*)    All other components within normal limits  URINALYSIS COMPLETEWITH MICROSCOPIC ([facility] ONLY) - Abnormal; Notable for the following:    Color, Urine YELLOW (*)    APPearance CLEAR (*)    Ketones, ur 1+ (*)    pH 9.0 (*)    Bacteria, UA RARE (*)    Squamous Epithelial / LPF 0-5 (*)    All other components within normal limits  LIPASE, BLOOD  TROPONIN I  CBG MONITORING, ED    RADIOLOGY  Chest x-ray IMPRESSION: Enlargement of cardiac silhouette post coronary PTCA and stenting.  Bronchitic and old granulomatous disease changes with question new 5 mm LEFT upper lobe nodule; CT chest recommended to exclude developing pulmonary nodule. ____________________________________________  FINAL ASSESSMENT AND PLAN  Weakness, Norovirus  Plan: Patient with labs and imaging as dictated above. Patient is no acute distress, currently feeling better after fluids and antiemetics. This is likely Norovirus infection, she has tolerated by mouth trial well here. She'll be discharged antiemetics and close follow-up with her doctor.   Emily FilbertWilliams, Scot Shiraishi E, MD   Emily FilbertJonathan E Betty Brooks, MD 06/15/15 [id number]

## 2015-06-15 NOTE — ED Notes (Signed)
Pt discharged home after verbalizing understanding of discharge instructions; nad noted. 

## 2015-06-15 NOTE — ED Notes (Signed)
Family reports n/v/d since Sunday, more intense on Monday. Low food intake, 4-5 x vomiting and diarrhea per day. Pt also very weak.

## 2015-06-15 NOTE — ED Notes (Signed)
Pt tolerated water well, although stated "too cold"

## 2015-06-28 ENCOUNTER — Encounter: Payer: Self-pay | Admitting: *Deleted

## 2015-06-28 ENCOUNTER — Observation Stay
Admission: EM | Admit: 2015-06-28 | Discharge: 2015-06-29 | Disposition: A | Discharge status: Home / Self Care | Payer: Medicare Other | Attending: Internal Medicine | Admitting: Internal Medicine

## 2015-06-28 ENCOUNTER — Emergency Department: Payer: Medicare Other

## 2015-06-28 DIAGNOSIS — M4854XD Collapsed vertebra, not elsewhere classified, thoracic region, subsequent encounter for fracture with routine healing: Secondary | ICD-10-CM | POA: Diagnosis not present

## 2015-06-28 DIAGNOSIS — R9431 Abnormal electrocardiogram [ECG] [EKG]: Secondary | ICD-10-CM | POA: Diagnosis not present

## 2015-06-28 DIAGNOSIS — S098XXA Other specified injuries of head, initial encounter: Secondary | ICD-10-CM | POA: Diagnosis not present

## 2015-06-28 DIAGNOSIS — W19XXXA Unspecified fall, initial encounter: Secondary | ICD-10-CM | POA: Insufficient documentation

## 2015-06-28 DIAGNOSIS — S22080A Wedge compression fracture of T11-T12 vertebra, initial encounter for closed fracture: Secondary | ICD-10-CM | POA: Diagnosis not present

## 2015-06-28 DIAGNOSIS — T148 Other injury of unspecified body region: Secondary | ICD-10-CM | POA: Diagnosis present

## 2015-06-28 DIAGNOSIS — Z8249 Family history of ischemic heart disease and other diseases of the circulatory system: Secondary | ICD-10-CM | POA: Diagnosis not present

## 2015-06-28 DIAGNOSIS — I252 Old myocardial infarction: Secondary | ICD-10-CM | POA: Insufficient documentation

## 2015-06-28 DIAGNOSIS — E119 Type 2 diabetes mellitus without complications: Secondary | ICD-10-CM | POA: Diagnosis not present

## 2015-06-28 DIAGNOSIS — S0181XA Laceration without foreign body of other part of head, initial encounter: Secondary | ICD-10-CM | POA: Diagnosis not present

## 2015-06-28 DIAGNOSIS — M545 Low back pain: Secondary | ICD-10-CM | POA: Diagnosis not present

## 2015-06-28 DIAGNOSIS — Z7982 Long term (current) use of aspirin: Secondary | ICD-10-CM | POA: Diagnosis not present

## 2015-06-28 DIAGNOSIS — S0003XA Contusion of scalp, initial encounter: Secondary | ICD-10-CM | POA: Insufficient documentation

## 2015-06-28 DIAGNOSIS — Z79899 Other long term (current) drug therapy: Secondary | ICD-10-CM | POA: Diagnosis not present

## 2015-06-28 DIAGNOSIS — M8008XA Age-related osteoporosis with current pathological fracture, vertebra(e), initial encounter for fracture: Secondary | ICD-10-CM

## 2015-06-28 DIAGNOSIS — I10 Essential (primary) hypertension: Secondary | ICD-10-CM | POA: Diagnosis not present

## 2015-06-28 DIAGNOSIS — Z7984 Long term (current) use of oral hypoglycemic drugs: Secondary | ICD-10-CM | POA: Insufficient documentation

## 2015-06-28 DIAGNOSIS — S338XXA Sprain of other parts of lumbar spine and pelvis, initial encounter: Secondary | ICD-10-CM | POA: Insufficient documentation

## 2015-06-28 DIAGNOSIS — M8088XA Other osteoporosis with current pathological fracture, vertebra(e), initial encounter for fracture: Secondary | ICD-10-CM | POA: Diagnosis not present

## 2015-06-28 DIAGNOSIS — Z951 Presence of aortocoronary bypass graft: Secondary | ICD-10-CM | POA: Insufficient documentation

## 2015-06-28 DIAGNOSIS — S199XXA Unspecified injury of neck, initial encounter: Secondary | ICD-10-CM | POA: Diagnosis not present

## 2015-06-28 DIAGNOSIS — M5136 Other intervertebral disc degeneration, lumbar region: Secondary | ICD-10-CM | POA: Diagnosis not present

## 2015-06-28 DIAGNOSIS — S0990XA Unspecified injury of head, initial encounter: Secondary | ICD-10-CM | POA: Diagnosis not present

## 2015-06-28 DIAGNOSIS — I2510 Atherosclerotic heart disease of native coronary artery without angina pectoris: Secondary | ICD-10-CM | POA: Insufficient documentation

## 2015-06-28 DIAGNOSIS — I739 Peripheral vascular disease, unspecified: Secondary | ICD-10-CM | POA: Insufficient documentation

## 2015-06-28 DIAGNOSIS — S3992XA Unspecified injury of lower back, initial encounter: Secondary | ICD-10-CM | POA: Diagnosis not present

## 2015-06-28 DIAGNOSIS — IMO0002 Reserved for concepts with insufficient information to code with codable children: Secondary | ICD-10-CM

## 2015-06-28 DIAGNOSIS — S0101XA Laceration without foreign body of scalp, initial encounter: Secondary | ICD-10-CM | POA: Diagnosis not present

## 2015-06-28 HISTORY — DX: Wedge compression fracture of T11-T12 vertebra, initial encounter for closed fracture: S22.080A

## 2015-06-28 LAB — CBC WITH DIFFERENTIAL/PLATELET
BASOS ABS: 0 10*3/uL (ref 0–0.1)
BASOS PCT: 0 %
EOS ABS: 0 10*3/uL (ref 0–0.7)
EOS PCT: 0 %
HCT: 34 % — ABNORMAL LOW (ref 35.0–47.0)
HEMOGLOBIN: 11.2 g/dL — AB (ref 12.0–16.0)
LYMPHS PCT: 9 %
Lymphs Abs: 1 10*3/uL (ref 1.0–3.6)
MCH: 28.8 pg (ref 26.0–34.0)
MCHC: 33.1 g/dL (ref 32.0–36.0)
MCV: 87.2 fL (ref 80.0–100.0)
MONOS PCT: 6 %
Monocytes Absolute: 0.7 10*3/uL (ref 0.2–0.9)
NEUTROS ABS: 9.1 10*3/uL — AB (ref 1.4–6.5)
Neutrophils Relative %: 85 %
PLATELETS: 240 10*3/uL (ref 150–440)
RBC: 3.9 MIL/uL (ref 3.80–5.20)
RDW: 17.2 % — ABNORMAL HIGH (ref 11.5–14.5)
WBC: 10.8 10*3/uL (ref 3.6–11.0)

## 2015-06-28 LAB — URINALYSIS COMPLETE WITH MICROSCOPIC (ARMC ONLY)
BILIRUBIN URINE: NEGATIVE
Bacteria, UA: NONE SEEN
Glucose, UA: 50 mg/dL — AB
NITRITE: NEGATIVE
PH: 5 (ref 5.0–8.0)
Protein, ur: NEGATIVE mg/dL
Specific Gravity, Urine: 1.012 (ref 1.005–1.030)

## 2015-06-28 LAB — BASIC METABOLIC PANEL
ANION GAP: 8 (ref 5–15)
BUN: 17 mg/dL (ref 6–20)
CALCIUM: 9.2 mg/dL (ref 8.9–10.3)
CO2: 25 mmol/L (ref 22–32)
Chloride: 97 mmol/L — ABNORMAL LOW (ref 101–111)
Creatinine, Ser: 1.3 mg/dL — ABNORMAL HIGH (ref 0.44–1.00)
GFR, EST AFRICAN AMERICAN: 40 mL/min — AB (ref >60)
GFR, EST NON AFRICAN AMERICAN: 35 mL/min — AB (ref >60)
GLUCOSE: 111 mg/dL — AB (ref 65–99)
POTASSIUM: 4.4 mmol/L (ref 3.5–5.1)
SODIUM: 130 mmol/L — AB (ref 135–145)

## 2015-06-28 LAB — GLUCOSE, CAPILLARY
GLUCOSE-CAPILLARY: 186 mg/dL — AB (ref 65–99)
GLUCOSE-CAPILLARY: 111 mg/dL — AB (ref 65–99)
Glucose-Capillary: 169 mg/dL — ABNORMAL HIGH (ref 65–99)

## 2015-06-28 LAB — TROPONIN I

## 2015-06-28 MED ORDER — METFORMIN HCL 500 MG PO TABS: 500.0000 mg | ORAL_TABLET | Freq: Two times a day (BID) | ORAL | Status: DC

## 2015-06-28 MED ORDER — MORPHINE SULFATE (PF) 2 MG/ML IV SOLN
2.0000 mg | Freq: Once | INTRAVENOUS | Status: DC
  Filled 2015-06-28: qty 1

## 2015-06-28 MED ORDER — METFORMIN HCL 500 MG PO TABS
1000.0000 mg | ORAL_TABLET | Freq: Every day | ORAL | Status: DC
  Administered 2015-06-28 – 2015-06-29 (×2): 1000 mg via ORAL
  Filled 2015-06-28 (×2): qty 2

## 2015-06-28 MED ORDER — LIDOCAINE-EPINEPHRINE (PF) 1 %-1:200000 IJ SOLN
INTRAMUSCULAR | Status: AC
  Administered 2015-06-28: 05:00:00
  Filled 2015-06-28: qty 30

## 2015-06-28 MED ORDER — GABAPENTIN 100 MG PO CAPS
100.0000 mg | ORAL_CAPSULE | Freq: Three times a day (TID) | ORAL | Status: DC
  Administered 2015-06-28 – 2015-06-29 (×4): 100 mg via ORAL
  Filled 2015-06-28 (×4): qty 1

## 2015-06-28 MED ORDER — PANTOPRAZOLE SODIUM 40 MG PO TBEC
40.0000 mg | DELAYED_RELEASE_TABLET | Freq: Every day | ORAL | Status: DC
  Administered 2015-06-28 – 2015-06-29 (×2): 40 mg via ORAL
  Filled 2015-06-28 (×2): qty 1

## 2015-06-28 MED ORDER — OXYCODONE-ACETAMINOPHEN 5-325 MG PO TABS
1.0000 | ORAL_TABLET | Freq: Once | ORAL | Status: AC
  Administered 2015-06-28: 1 via ORAL
  Filled 2015-06-28: qty 1

## 2015-06-28 MED ORDER — ONDANSETRON HCL 4 MG/2ML IJ SOLN
4.0000 mg | Freq: Once | INTRAMUSCULAR | Status: DC
  Filled 2015-06-28: qty 2

## 2015-06-28 MED ORDER — METFORMIN HCL 500 MG PO TABS
500.0000 mg | ORAL_TABLET | Freq: Every day | ORAL | Status: DC
  Administered 2015-06-28: 500 mg via ORAL
  Filled 2015-06-28: qty 1

## 2015-06-28 MED ORDER — CALCIUM CARBONATE-VITAMIN D 500-200 MG-UNIT PO TABS
1.0000 | ORAL_TABLET | Freq: Every day | ORAL | Status: DC
  Administered 2015-06-28 – 2015-06-29 (×2): 1 via ORAL
  Filled 2015-06-28 (×2): qty 1

## 2015-06-28 MED ORDER — ACETAMINOPHEN 325 MG PO TABS
650.0000 mg | ORAL_TABLET | Freq: Once | ORAL | Status: AC
  Administered 2015-06-28: 650 mg via ORAL
  Filled 2015-06-28: qty 2

## 2015-06-28 MED ORDER — ISOSORBIDE MONONITRATE 20 MG PO TABS
20.0000 mg | ORAL_TABLET | Freq: Every day | ORAL | Status: DC
Start: 2015-06-28 — End: 2015-06-29
  Administered 2015-06-28 – 2015-06-29 (×2): 20 mg via ORAL
  Filled 2015-06-28 (×2): qty 1

## 2015-06-28 MED ORDER — CITALOPRAM HYDROBROMIDE 20 MG PO TABS
10.0000 mg | ORAL_TABLET | Freq: Every evening | ORAL | Status: DC
  Administered 2015-06-28: 10 mg via ORAL
  Filled 2015-06-28: qty 1

## 2015-06-28 MED ORDER — LOSARTAN POTASSIUM 50 MG PO TABS
100.0000 mg | ORAL_TABLET | Freq: Every day | ORAL | Status: DC
  Administered 2015-06-28 – 2015-06-29 (×2): 100 mg via ORAL
  Filled 2015-06-28 (×2): qty 2

## 2015-06-28 MED ORDER — OXYCODONE-ACETAMINOPHEN 5-325 MG PO TABS: 1.0000 | ORAL_TABLET | Freq: Four times a day (QID) | ORAL | Status: DC | PRN

## 2015-06-28 MED ORDER — PIOGLITAZONE HCL 30 MG PO TABS
15.0000 mg | ORAL_TABLET | Freq: Every day | ORAL | Status: DC
  Administered 2015-06-28 – 2015-06-29 (×2): 15 mg via ORAL
  Filled 2015-06-28: qty 1
  Filled 2015-06-28: qty 2

## 2015-06-28 MED ORDER — FOLIC ACID 1 MG PO TABS
1.0000 mg | ORAL_TABLET | Freq: Every day | ORAL | Status: DC
  Administered 2015-06-28 – 2015-06-29 (×2): 1 mg via ORAL
  Filled 2015-06-28 (×2): qty 1

## 2015-06-28 MED ORDER — HEPARIN SODIUM (PORCINE) 5000 UNIT/ML IJ SOLN
5000.0000 [IU] | Freq: Three times a day (TID) | INTRAMUSCULAR | Status: DC
  Administered 2015-06-28 – 2015-06-29 (×3): 5000 [IU] via SUBCUTANEOUS
  Filled 2015-06-28 (×3): qty 1

## 2015-06-28 MED ORDER — PRAVASTATIN SODIUM 20 MG PO TABS
20.0000 mg | ORAL_TABLET | Freq: Every day | ORAL | Status: DC
  Administered 2015-06-28: 20 mg via ORAL
  Filled 2015-06-28: qty 1

## 2015-06-28 MED ORDER — IBUPROFEN 400 MG PO TABS
400.0000 mg | ORAL_TABLET | Freq: Four times a day (QID) | ORAL | Status: DC | PRN
  Administered 2015-06-28 (×2): 400 mg via ORAL
  Filled 2015-06-28 (×2): qty 1

## 2015-06-28 MED ORDER — INSULIN ASPART 100 UNIT/ML ~~LOC~~ SOLN: 0.0000 [IU] | Freq: Three times a day (TID) | SUBCUTANEOUS | Status: DC

## 2015-06-28 NOTE — ED Notes (Signed)
Pt presents after falling at home. Pt denies LOC, sustained small laceration to posterior right scalp, bleeding controlled at this time. Pt has cardiac hx, cath w/ stent placed, taking ASA 81 mg. Pt states she slipped causing her to fall. Pt c/o low back pain and pain at location of laceration.

## 2015-06-28 NOTE — ED Notes (Signed)
Report given to Dava, RN on ortho.

## 2015-06-28 NOTE — Progress Notes (Signed)
PT Hold Note  Patient Details Name: Peter CongoJeliben B Wegner MRN: [id number] DOB: 14-Jul-1921   Cancelled Treatment:     Chart reviewed and RN consulted. Spoke with family however pt does not speak [place]. RN has already requested interpretation services. RN to call PT when interpreter arrives. Will attempt PT evaluation when interpreter available.  Sharalyn InkJason D Huprich PT, DPT   Huprich,Jason 06/28/2015, 11:17 AM

## 2015-06-28 NOTE — ED Notes (Signed)
Pt given tea per request. OK to drink liquids at this time per MD.

## 2015-06-28 NOTE — ED Notes (Signed)
Two attempts by this RN for IV access were unsuccessful. IV team called to get IV access and labs.

## 2015-06-28 NOTE — Consult Note (Signed)
ORTHOPAEDIC CONSULTATION  REQUESTING PHYSICIAN: Altamese DillingVaibhavkumar Vachhani, MD  Chief Complaint: Low back pain  HPI: Angela Vargas is a 82 y.o. female who complains of  low back pain following a fall at home yesterday.  She also had a head injury.  Her daughter is present and indicates that the pain is at the base of the lumbar spine.  The patient also confirms the lumbosacral sacral articulation is the source of pain.  There is no complaint of numbness or tingling in the lower extremities.  X-rays in the emergency room yesterday show a T12 compression fracture.  This appears to be old and healed to me.  No acute changes are seen in the lower lumbar region.  There is mild arthritis. Past Medical History  Diagnosis Date  . Diabetes mellitus without complication (HCC)   . Myocardial infarct (HCC)   . Hypertension   . Compression fracture of T12 vertebra [facility](HCC)    Past Surgical History  Procedure Laterality Date  . Stent placement iliac ([facility] hx)     Social History   Social History  . Marital Status: Widowed    Spouse Name: N/A  . Number of Children: N/A  . Years of Education: N/A   Social History Main Topics  . Smoking status: Never Smoker   . Smokeless tobacco: None  . Alcohol Use: No  . Drug Use: No  . Sexual Activity: Not Asked   Other Topics Concern  . None   Social History Narrative   Family History  Problem Relation Age of Onset  . Hypertension Father    No Known Allergies Prior to Admission medications   Medication Sig Start Date End Date Taking? Authorizing Provider  aspirin EC 81 MG tablet Take 81 mg by mouth daily.   Yes Historical Provider, MD  Calcium Carbonate-Vitamin D (CALCIUM 600+D) 600-200 MG-UNIT TABS Take 1 tablet by mouth daily.   Yes Historical Provider, MD  citalopram (CELEXA) 10 MG tablet Take 1 tablet by mouth every evening. Take around 5pm to 7pm. 05/31/15  Yes Historical Provider, MD  clopidogrel (PLAVIX) 75 MG tablet Take 1 tablet by mouth daily.  05/06/15  Yes Historical Provider, MD  CRANBERRY PO Take 1 tablet by mouth daily.   Yes Historical Provider, MD  folic acid (FOLVITE) 1 MG tablet Take 1 tablet by mouth daily. 03/30/15  Yes Historical Provider, MD  gabapentin (NEURONTIN) 100 MG capsule Take 100 mg by mouth 3 (three) times daily.   Yes Historical Provider, MD  isosorbide mononitrate (ISMO,MONOKET) 20 MG tablet Take 1 tablet by mouth daily. 06/04/15  Yes Historical Provider, MD  losartan (COZAAR) 100 MG tablet Take 1 tablet by mouth daily. 04/06/15  Yes Historical Provider, MD  lovastatin (MEVACOR) 20 MG tablet Take 1 tablet by mouth at bedtime. 04/06/15  Yes Historical Provider, MD  metFORMIN (GLUCOPHAGE) 500 MG tablet Take 500-1,000 mg by mouth 2 (two) times daily. Take 2 tablets (1000 mg) in the morning and take 1 tablet (500 mg) in the evening. 05/06/15  Yes Historical Provider, MD  nitrofurantoin, macrocrystal-monohydrate, (MACROBID) 100 MG capsule Take 100 mg by mouth daily. Take 1 capsule on Monday, Wednesday, and Friday for UTI prophylaxis.   Yes Historical Provider, MD  omeprazole (PRILOSEC) 20 MG capsule Take 20 mg by mouth daily.   Yes Historical Provider, MD  ondansetron (ZOFRAN ODT) 4 MG disintegrating tablet Take 1 tablet (4 mg total) by mouth every 8 (eight) hours as needed for nausea or vomiting. 06/15/15  Yes Christiane HaJonathan  Donnella Bi, MD  pioglitazone (ACTOS) 15 MG tablet Take 1 tablet by mouth daily. 05/06/15  Yes Historical Provider, MD   Dg Lumbar Spine Complete  06/28/2015  CLINICAL DATA:  Unwitnessed fall.  Right lower back pain. EXAM: LUMBAR SPINE - COMPLETE 4+ VIEW COMPARISON:  CT 09/05/2013 FINDINGS: The lumbar vertebrae are normal in height. There is anterior compression of T12 with greater than 50% height reduction. This is of indeterminate age but could be recent. It is new from 09/05/2013. No bone lesion or bony destruction. Mild degenerative narrowing of the L4-5 intervertebral disc space. IMPRESSION: Moderate  compression of T12, possibly recent. The lumbar vertebrae are intact. Electronically Signed   By: Ellery Plunk M.D.   On: 06/28/2015 04:18   Ct Head Wo Contrast  06/28/2015  CLINICAL DATA:  Larey Seat at home, with small laceration to the posterior right scalp. EXAM: CT HEAD WITHOUT CONTRAST CT CERVICAL SPINE WITHOUT CONTRAST TECHNIQUE: Multidetector CT imaging of the head and cervical spine was performed following the standard protocol without intravenous contrast. Multiplanar CT image reconstructions of the cervical spine were also generated. COMPARISON:  09/05/2013 FINDINGS: CT HEAD FINDINGS There is no intracranial hemorrhage or extra-axial fluid collection. There is moderately severe generalized atrophy. There is chronic appearing white matter hypodensity which may represent small vessel ischemic disease. No acute brain abnormality is evident. No interval change is evident from 09/05/2013. There is a posterior right scalp hematoma. The calvarium and skullbase are intact. CT CERVICAL SPINE FINDINGS The vertebral column, pedicles and facet articulations are intact. There is no evidence of acute fracture. No acute soft tissue abnormalities are evident. Mild degenerative cervical disc changes are present from C5 through C7. IMPRESSION: 1. Negative for acute intracranial traumatic injury. There is moderately severe generalized atrophy and chronic small vessel disease. 2. Negative for acute cervical spine fracture. Electronically Signed   By: Ellery Plunk M.D.   On: 06/28/2015 03:56   Ct Cervical Spine Wo Contrast  06/28/2015  CLINICAL DATA:  Larey Seat at home, with small laceration to the posterior right scalp. EXAM: CT HEAD WITHOUT CONTRAST CT CERVICAL SPINE WITHOUT CONTRAST TECHNIQUE: Multidetector CT imaging of the head and cervical spine was performed following the standard protocol without intravenous contrast. Multiplanar CT image reconstructions of the cervical spine were also generated. COMPARISON:   09/05/2013 FINDINGS: CT HEAD FINDINGS There is no intracranial hemorrhage or extra-axial fluid collection. There is moderately severe generalized atrophy. There is chronic appearing white matter hypodensity which may represent small vessel ischemic disease. No acute brain abnormality is evident. No interval change is evident from 09/05/2013. There is a posterior right scalp hematoma. The calvarium and skullbase are intact. CT CERVICAL SPINE FINDINGS The vertebral column, pedicles and facet articulations are intact. There is no evidence of acute fracture. No acute soft tissue abnormalities are evident. Mild degenerative cervical disc changes are present from C5 through C7. IMPRESSION: 1. Negative for acute intracranial traumatic injury. There is moderately severe generalized atrophy and chronic small vessel disease. 2. Negative for acute cervical spine fracture. Electronically Signed   By: Ellery Plunk M.D.   On: 06/28/2015 03:56    Positive ROS: All other systems have been reviewed and were otherwise negative with the exception of those mentioned in the HPI and as above.  Physical Exam: General: Alert, no acute distress Cardiovascular: No pedal edema Respiratory: No cyanosis, no use of accessory musculature GI: No organomegaly, abdomen is soft and non-tender Skin: No lesions in the area of chief complaint Neurologic:  Sensation intact distally Psychiatric: Patient is competent for consent with normal mood and affect Lymphatic: No axillary or cervical lymphadenopathy  MUSCULOSKELETAL: The patient is sleepy but arousable.  She is mildly tender to at the lumbosacral junction.  She has mild kyphosis.  There is no tenderness to palpation or percussion at the thoracolumbar junction.  Straight leg raising is negative bilaterally.  Hips and knees has satisfactory motion.  He moves both feet well and has good sensation.  Assessment: Lumbosacral sprain/contusion. Old healed T12  fracture.  Plan: Lumbar corset and heat. PT for ambulation. Follow-up in 2 weeks if still symptomatic.  Valinda Hoar, MD [phone]   06/28/2015 2:39 PM

## 2015-06-28 NOTE — H&P (Signed)
[facility] - [place] [facility]   PATIENT NAME: Angela Vargas    MR#:  [id number]  DATE OF BIRTH:  Jul 30, 1921  DATE OF ADMISSION:  06/28/2015  PRIMARY CARE PHYSICIAN: Lyndon Code, MD   REQUESTING/REFERRING PHYSICIAN: McShane  CHIEF COMPLAINT:   Chief Complaint  Patient presents with  . Fall    HISTORY OF PRESENT ILLNESS: Angela Vargas  is a 82 y.o. female with a known history of Hypertension, diabetes, coronary artery disease status post CABG- lives independent life with her family and able to walk with a walker, tonight early morning at 1:00 she got up to go to the bathroom and accidentally fall down and since then having pain in her lower back so brought to the emergency room by family. She was found to have T 12 vertebral compression fracture. An due to pain she was not able to walk so given his admission to hospitalist team. Her grandson is present in the room at the time of my interview and he and patient both denies having any associated palpitation, chest pain, dizziness, loss of consciousness associated with the fall.  PAST MEDICAL HISTORY:   Past Medical History  Diagnosis Date  . Diabetes mellitus without complication (HCC)   . Myocardial infarct (HCC)   . Hypertension     PAST SURGICAL HISTORY: Past Surgical History  Procedure Laterality Date  . Stent placement iliac ([facility] hx)      SOCIAL HISTORY:  Social History  Substance Use Topics  . Smoking status: Never Smoker   . Smokeless tobacco: Not on file  . Alcohol Use: No    FAMILY HISTORY:  Family History  Problem Relation Age of Onset  . Hypertension Father     DRUG ALLERGIES: No Known Allergies  REVIEW OF SYSTEMS:   CONSTITUTIONAL: No fever, fatigue or weakness.  EYES: No blurred or double vision.  EARS, NOSE, AND THROAT: No tinnitus or ear pain.  RESPIRATORY: No cough, shortness of breath, wheezing or hemoptysis.  CARDIOVASCULAR: No chest pain, orthopnea, edema.   GASTROINTESTINAL: No nausea, vomiting, diarrhea or abdominal pain.  GENITOURINARY: No dysuria, hematuria.  ENDOCRINE: No polyuria, nocturia,  HEMATOLOGY: No anemia, easy bruising or bleeding SKIN: No rash or lesion. MUSCULOSKELETAL: Have severe low back pain. NEUROLOGIC: No tingling, numbness, weakness.  PSYCHIATRY: No anxiety or depression.   MEDICATIONS AT HOME:  Prior to Admission medications   Medication Sig Start Date End Date Taking? Authorizing Provider  aspirin EC 81 MG tablet Take 81 mg by mouth daily.   Yes Historical Provider, MD  Calcium Carbonate-Vitamin D (CALCIUM 600+D) 600-200 MG-UNIT TABS Take 1 tablet by mouth daily.   Yes Historical Provider, MD  citalopram (CELEXA) 10 MG tablet Take 1 tablet by mouth every evening. Take around 5pm to 7pm. 05/31/15  Yes Historical Provider, MD  clopidogrel (PLAVIX) 75 MG tablet Take 1 tablet by mouth daily. 05/06/15  Yes Historical Provider, MD  CRANBERRY PO Take 1 tablet by mouth daily.   Yes Historical Provider, MD  folic acid (FOLVITE) 1 MG tablet Take 1 tablet by mouth daily. 03/30/15  Yes Historical Provider, MD  gabapentin (NEURONTIN) 100 MG capsule Take 100 mg by mouth 3 (three) times daily.   Yes Historical Provider, MD  isosorbide mononitrate (ISMO,MONOKET) 20 MG tablet Take 1 tablet by mouth daily. 06/04/15  Yes Historical Provider, MD  losartan (COZAAR) 100 MG tablet Take 1 tablet by mouth daily. 04/06/15  Yes Historical Provider, MD  lovastatin (MEVACOR) 20 MG  tablet Take 1 tablet by mouth at bedtime. 04/06/15  Yes Historical Provider, MD  metFORMIN (GLUCOPHAGE) 500 MG tablet Take 500-1,000 mg by mouth 2 (two) times daily. Take 2 tablets (1000 mg) in the morning and take 1 tablet (500 mg) in the evening. 05/06/15  Yes Historical Provider, MD  nitrofurantoin, macrocrystal-monohydrate, (MACROBID) 100 MG capsule Take 100 mg by mouth daily. Take 1 capsule on Monday, Wednesday, and Friday for UTI prophylaxis.   Yes Historical Provider,  MD  omeprazole (PRILOSEC) 20 MG capsule Take 20 mg by mouth daily.   Yes Historical Provider, MD  ondansetron (ZOFRAN ODT) 4 MG disintegrating tablet Take 1 tablet (4 mg total) by mouth every 8 (eight) hours as needed for nausea or vomiting. 06/15/15  Yes Emily Filbert, MD  pioglitazone (ACTOS) 15 MG tablet Take 1 tablet by mouth daily. 05/06/15  Yes Historical Provider, MD      PHYSICAL EXAMINATION:   VITAL SIGNS: Blood pressure 109/59, pulse 91, temperature 97.8 F (36.6 C), temperature source Oral, resp. rate 18, height 5' (1.524 m), weight 56.7 kg (125 lb), SpO2 100 %.  GENERAL:  82 y.o.-year-old patient lying in the bed with no acute distress.  EYES: Pupils equal, round, reactive to light and accommodation. No scleral icterus. Extraocular muscles intact.  HEENT: Head atraumatic, normocephalic. Oropharynx and nasopharynx clear.  NECK:  Supple, no jugular venous distention. No thyroid enlargement, no tenderness.  LUNGS: Normal breath sounds bilaterally, no wheezing, rales,rhonchi or crepitation. No use of accessory muscles of respiration.  CARDIOVASCULAR: S1, S2 normal. No murmurs, rubs, or gallops.  ABDOMEN: Soft, nontender, nondistended. Bowel sounds present. No organomegaly or mass.  EXTREMITIES: No pedal edema, cyanosis, or clubbing. Lower back tender. NEUROLOGIC: Cranial nerves II through XII are intact. Muscle strength 4/5 in all extremities. Sensation intact. Gait not checked. Able to lift both legs , it causes pain on doing more. Sensation on legs are intact. PSYCHIATRIC: The patient is alert and oriented x 3.  SKIN: No obvious rash, lesion, or ulcer.   LABORATORY PANEL:   CBC  Recent Labs Lab 06/28/15 0645  WBC 10.8  HGB 11.2*  HCT 34.0*  PLT 240  MCV 87.2  MCH 28.8  MCHC 33.1  RDW 17.2*  LYMPHSABS 1.0  MONOABS 0.7  EOSABS 0.0  BASOSABS 0.0    ------------------------------------------------------------------------------------------------------------------  Chemistries   Recent Labs Lab 06/28/15 0645  NA 130*  K 4.4  CL 97*  CO2 25  GLUCOSE 111*  BUN 17  CREATININE 1.30*  CALCIUM 9.2   ------------------------------------------------------------------------------------------------------------------ estimated creatinine clearance is 21.8 mL/min (by C-G formula based on Cr of 1.3). ------------------------------------------------------------------------------------------------------------------ No results for input(s): TSH, T4TOTAL, T3FREE, THYROIDAB in the last 72 hours.  Invalid input(s): FREET3   Coagulation profile No results for input(s): INR, PROTIME in the last 168 hours. ------------------------------------------------------------------------------------------------------------------- No results for input(s): DDIMER in the last 72 hours. -------------------------------------------------------------------------------------------------------------------  Cardiac Enzymes  Recent Labs Lab 06/28/15 0645  TROPONINI <0.03   ------------------------------------------------------------------------------------------------------------------ Invalid input(s): POCBNP  ---------------------------------------------------------------------------------------------------------------  Urinalysis    Component Value Date/Time   COLORURINE YELLOW* 06/28/2015 0616   COLORURINE Straw 09/05/2013 0856   APPEARANCEUR CLEAR* 06/28/2015 0616   APPEARANCEUR Clear 09/05/2013 0856   LABSPEC 1.012 06/28/2015 0616   LABSPEC 1.008 09/05/2013 0856   PHURINE 5.0 06/28/2015 0616   PHURINE 8.0 09/05/2013 0856   GLUCOSEU 50* 06/28/2015 0616   GLUCOSEU >=500 09/05/2013 0856   HGBUR 1+* 06/28/2015 0616   HGBUR 1+ 09/05/2013 0856   BILIRUBINUR NEGATIVE 06/28/2015  0616   BILIRUBINUR Negative 09/05/2013 0856   KETONESUR TRACE*  06/28/2015 0616   KETONESUR Trace 09/05/2013 0856   PROTEINUR NEGATIVE 06/28/2015 0616   PROTEINUR Negative 09/05/2013 0856   NITRITE NEGATIVE 06/28/2015 0616   NITRITE Negative 09/05/2013 0856   LEUKOCYTESUR TRACE* 06/28/2015 0616   LEUKOCYTESUR Negative 09/05/2013 0856     RADIOLOGY: Dg Lumbar Spine Complete  06/28/2015  CLINICAL DATA:  Unwitnessed fall.  Right lower back pain. EXAM: LUMBAR SPINE - COMPLETE 4+ VIEW COMPARISON:  CT 09/05/2013 FINDINGS: The lumbar vertebrae are normal in height. There is anterior compression of T12 with greater than 50% height reduction. This is of indeterminate age but could be recent. It is new from 09/05/2013. No bone lesion or bony destruction. Mild degenerative narrowing of the L4-5 intervertebral disc space. IMPRESSION: Moderate compression of T12, possibly recent. The lumbar vertebrae are intact. Electronically Signed   By: Ellery Plunk M.D.   On: 06/28/2015 04:18   Ct Head Wo Contrast  06/28/2015  CLINICAL DATA:  Larey Seat at home, with small laceration to the posterior right scalp. EXAM: CT HEAD WITHOUT CONTRAST CT CERVICAL SPINE WITHOUT CONTRAST TECHNIQUE: Multidetector CT imaging of the head and cervical spine was performed following the standard protocol without intravenous contrast. Multiplanar CT image reconstructions of the cervical spine were also generated. COMPARISON:  09/05/2013 FINDINGS: CT HEAD FINDINGS There is no intracranial hemorrhage or extra-axial fluid collection. There is moderately severe generalized atrophy. There is chronic appearing white matter hypodensity which may represent small vessel ischemic disease. No acute brain abnormality is evident. No interval change is evident from 09/05/2013. There is a posterior right scalp hematoma. The calvarium and skullbase are intact. CT CERVICAL SPINE FINDINGS The vertebral column, pedicles and facet articulations are intact. There is no evidence of acute fracture. No acute soft tissue  abnormalities are evident. Mild degenerative cervical disc changes are present from C5 through C7. IMPRESSION: 1. Negative for acute intracranial traumatic injury. There is moderately severe generalized atrophy and chronic small vessel disease. 2. Negative for acute cervical spine fracture. Electronically Signed   By: Ellery Plunk M.D.   On: 06/28/2015 03:56   Ct Cervical Spine Wo Contrast  06/28/2015  CLINICAL DATA:  Larey Seat at home, with small laceration to the posterior right scalp. EXAM: CT HEAD WITHOUT CONTRAST CT CERVICAL SPINE WITHOUT CONTRAST TECHNIQUE: Multidetector CT imaging of the head and cervical spine was performed following the standard protocol without intravenous contrast. Multiplanar CT image reconstructions of the cervical spine were also generated. COMPARISON:  09/05/2013 FINDINGS: CT HEAD FINDINGS There is no intracranial hemorrhage or extra-axial fluid collection. There is moderately severe generalized atrophy. There is chronic appearing white matter hypodensity which may represent small vessel ischemic disease. No acute brain abnormality is evident. No interval change is evident from 09/05/2013. There is a posterior right scalp hematoma. The calvarium and skullbase are intact. CT CERVICAL SPINE FINDINGS The vertebral column, pedicles and facet articulations are intact. There is no evidence of acute fracture. No acute soft tissue abnormalities are evident. Mild degenerative cervical disc changes are present from C5 through C7. IMPRESSION: 1. Negative for acute intracranial traumatic injury. There is moderately severe generalized atrophy and chronic small vessel disease. 2. Negative for acute cervical spine fracture. Electronically Signed   By: Ellery Plunk M.D.   On: 06/28/2015 03:56    EKG: Orders placed or performed during the hospital encounter of 06/28/15  . ED EKG  . ED EKG    IMPRESSION AND PLAN:  *  Vertebral compression fracture   Will keep on the pain management,  orthopedic consult for further management.   Because of her age and history of cardiac bypass surgery- she is at moderate risk for the procedure but considering her independence at age I would suggest to proceed with the procedure if orthopedic doctor decided to go for that. Patient is optimized for the therapy. We will continue monitoring.  * Hypertension   Currently stable, continue home medications.  * Diabetes  Continue metformin and pioglitazone and will keep on insulin sliding scale coverage.  * Coronary artery disease   Continue aspirin but I'll hold Plavix for now until the decision is made about the procedure by orthopedics, continue other medications.  All the records are reviewed and case discussed with ED provider. Management plans discussed with the patient, family and they are in agreement.  CODE STATUS: Full code Code Status History    This patient does not have a recorded code status. Please follow your organizational policy for patients in this situation.       TOTAL TIME TAKING CARE OF THIS PATIENT: 50 minutes.  Plan is discussed with patient's grandson who was present in the room.  Altamese DillingVACHHANI, Shailey Butterbaugh M.D on 06/28/2015   Between 7am to 6pm - Pager - [phone]  After 6pm go to [email or web address] - password [email or web address] [facility]  [facility]  Office  [phone]  CC: Primary care physician; Lyndon CodeKHAN, FOZIA M, MD   Note: This dictation was prepared with Dragon dictation along with smaller phrase technology. Any transcriptional errors that result from this process are unintentional.

## 2015-06-28 NOTE — Care Management Note (Signed)
Case Management Note  Patient Details  Name: COILA WARDELL MRN: [id number] Date of Birth: 28-Oct-1921  Subjective/Objective:                   Met with patient's son in presence of patient however she cannot speak [place]. Son states that family rotates shifts to care for patient. He states patient struggles with home PT due to pain. He is unsure name of agency. He has questions about getting someone to give her a bath in the home. Her PCP is Dr. Clayborn Bigness. She is Medicare OBServation and cannot pay privately to go to SNF/rehab.  Action/Plan: List of PCS agencies provided which would be private pay. List of home health agencies left with son for review. Please order PT, NA, SN, and social worker to discharge order.    Expected Discharge Date:                  Expected Discharge Plan:     In-House Referral:     Discharge planning Services  CM Consult  Post Acute Care Choice:  Home Health Choice offered to:  Sibling  DME Arranged:    DME Agency:     HH Arranged:  PT, NA, Social Work [employer] Agency:     Status of Service:  In process, will continue to follow  Medicare Important Message Given:    Date Medicare IM Given:    Medicare IM give by:    Date Additional Medicare IM Given:    Additional Medicare Important Message give by:     If discussed at [place] of Stay Meetings, dates discussed:    Additional Comments:  Marshell Garfinkel, RN 06/28/2015, 10:55 AM

## 2015-06-28 NOTE — ED Notes (Signed)
Admitting md in to see pt at this time. 

## 2015-06-28 NOTE — ED Notes (Signed)
Pt resting on stretcher with family at bedside. Pt awaiting possible admission to hospital.

## 2015-06-28 NOTE — Progress Notes (Addendum)
Translator mr Angela Vargas here to translate in hindu. Pt denies any dizziness,h/a,blurred vision or tremors.. Pt verbalizes experiencing low back pain and thigh pain. Moving legs well

## 2015-06-28 NOTE — ED Provider Notes (Signed)
[facility] Emergency Department Provider Note  ____________________________________________   I have reviewed the triage vital signs and the nursing notes.   HISTORY  Chief Complaint Fall    HPI Angela Vargas is a 82 y.o. female presents after what appears to be a non-syncopal fall. She does not speaking which is a first language, translation is through family as patient speaks a very small some dialect of an [place] language. I have offered to try to obtain an interpreter but the family is very Well during interpreting. In any event, the patient had a fall tonight. They heard her fall. They are not certain if she passed out before she fell but she certainly was awake when she fell. She states she just fell. There is no known syncope. She is otherwise at her baseline. Patient has taken aspirin, she clearly hit her head and hasn't laceration to her occiput. She is complaining of low back pain. No complaints of focal numbness or weakness.  Past Medical History  Diagnosis Date  . Diabetes mellitus without complication (HCC)   . Myocardial infarct (HCC)   . Hypertension     There are no active problems to display for this patient.   Past Surgical History  Procedure Laterality Date  . Stent placement iliac ([facility] hx)      Current Outpatient Rx  Name  Route  Sig  Dispense  Refill  . aspirin EC 81 MG tablet   Oral   Take 81 mg by mouth daily.         . Calcium Carbonate-Vitamin D (CALCIUM 600+D) 600-200 MG-UNIT TABS   Oral   Take 1 tablet by mouth daily.         . citalopram (CELEXA) 10 MG tablet   Oral   Take 1 tablet by mouth every evening. Take around 5pm to 7pm.      3   . clopidogrel (PLAVIX) 75 MG tablet   Oral   Take 1 tablet by mouth daily.      3   . CRANBERRY PO   Oral   Take 1 tablet by mouth daily.         . folic acid (FOLVITE) 1 MG tablet   Oral   Take 1 tablet by mouth daily.      3   . gabapentin (NEURONTIN) 100 MG  capsule   Oral   Take 100 mg by mouth 3 (three) times daily.         . isosorbide mononitrate (ISMO,MONOKET) 20 MG tablet   Oral   Take 1 tablet by mouth daily.      3   . losartan (COZAAR) 100 MG tablet   Oral   Take 1 tablet by mouth daily.      3   . lovastatin (MEVACOR) 20 MG tablet   Oral   Take 1 tablet by mouth at bedtime.      3   . metFORMIN (GLUCOPHAGE) 500 MG tablet   Oral   Take 500-1,000 mg by mouth 2 (two) times daily. Take 2 tablets (1000 mg) in the morning and take 1 tablet (500 mg) in the evening.      3   . nitrofurantoin, macrocrystal-monohydrate, (MACROBID) 100 MG capsule   Oral   Take 100 mg by mouth daily. Take 1 capsule on Monday, Wednesday, and Friday for UTI prophylaxis.         Marland Kitchen omeprazole (PRILOSEC) 20 MG capsule   Oral   Take  20 mg by mouth daily.         . ondansetron (ZOFRAN ODT) 4 MG disintegrating tablet   Oral   Take 1 tablet (4 mg total) by mouth every 8 (eight) hours as needed for nausea or vomiting.   20 tablet   0   . pioglitazone (ACTOS) 15 MG tablet   Oral   Take 1 tablet by mouth daily.      2     Allergies Review of patient's allergies indicates no known allergies.  History reviewed. No pertinent family history.  Social History Social History  Substance Use Topics  . Smoking status: Never Smoker   . Smokeless tobacco: None  . Alcohol Use: None    Review of Systems Constitutional: No fever/chills Eyes: No visual changes. ENT: No sore throat. No stiff neck no neck pain Cardiovascular: Denies chest pain. Respiratory: Denies shortness of breath. Gastrointestinal:   no vomiting.  No diarrhea.  No constipation. Genitourinary: Negative for dysuria. Musculoskeletal: Negative lower extremity swelling Skin: Negative for rash. Neurological: Negative for headaches, focal weakness or numbness. 10-point ROS otherwise negative.  ____________________________________________   PHYSICAL EXAM:  VITAL  SIGNS: ED Triage Vitals  Enc Vitals Group     BP 06/28/15 0230 138/60 mmHg     Pulse Rate 06/28/15 0230 91     Resp 06/28/15 0230 18     Temp 06/28/15 0230 97.8 F (36.6 C)     Temp Source 06/28/15 0230 Oral     SpO2 06/28/15 0230 96 %     Weight 06/28/15 0229 125 lb (56.7 kg)     Height 06/28/15 0229 5' (1.524 m)     Head Cir --      Peak Flow --      Pain Score 06/28/15 0230 8     Pain Loc --      Pain Edu? --      Excl. in GC? --     Constitutional: Alert and oriented. Well appearing and in no acute distress. Eyes: Conjunctivae are normal. PERRL. EOMI. Head: There is approximately a 2.7 cm laceration to the occiput with a hematoma and bleeding but no skull fracture noted Nose: No congestion/rhinnorhea. Mouth/Throat: Mucous membranes are moist.  Oropharynx non-erythematous. Neck: No stridor.   Nontender with no meningismus Cardiovascular: Normal rate, regular rhythm. Grossly normal heart sounds.  Good peripheral circulation. Respiratory: Normal respiratory effort.  No retractions. Lungs CTAB. Abdominal: Soft and nontender. No distention. No guarding no rebound Back:  Patient with focal lower thoracic back pain with no obvious deformity. there are no lesions noted. there is no CVA tenderness Musculoskeletal: No lower extremity tenderness. No joint effusions, no DVT signs strong distal pulses no edema Neurologic:  Normal speech and language. No gross focal neurologic deficits are appreciated.  Skin:  Skin is warm, dry and intact. No rash noted. Psychiatric: Mood and affect are normal. Speech and behavior are normal.  ____________________________________________   LABS (all labs ordered are listed, but only abnormal results are displayed)  Labs Reviewed  URINALYSIS COMPLETEWITH MICROSCOPIC ([facility] ONLY)  CBC WITH DIFFERENTIAL/PLATELET  TROPONIN I  BASIC METABOLIC PANEL   ____________________________________________  EKG  I personally interpreted any EKGs ordered by me  or triage Normal sinus rhythm rate 88 bpm no acute ST elevation or acute ST depression no acute ischemic changes ____________________________________________  RADIOLOGY  I reviewed any imaging ordered by me or triage that were performed during my shift and, if possible, patient and/or family made aware of  any abnormal findings. ____________________________________________   PROCEDURES  Procedure(s) performed: LACERATION REPAIR Performed by: Jeanmarie PlantJAMES A Yutaka Holberg Authorized by: Jeanmarie PlantJAMES A Kalaysia Demonbreun Consent: Verbal consent obtained. Risks and benefits: risks, benefits and alternatives were discussed Consent given by: patient Patient identity confirmed: provided demographic data Prepped and Draped in normal sterile fashion Wound explored  Laceration Location: Occipital  Laceration Length: 2.7 cm  No Foreign Bodies seen or palpated  Anesthesia: local infiltration  Local anesthetic: lidocaine 1% % with epinephrine  Anesthetic total: 4 ml  Irrigation method: syringe Amount of cleaning: standard  Skin closure: Staples   Number of sutures: 5   Technique: Interrupted   Patient tolerance: Patient tolerated the procedure well with no immediate complications.   Critical Care performed: None  ____________________________________________   INITIAL IMPRESSION / ASSESSMENT AND PLAN / ED COURSE  Pertinent labs & imaging results that were available during my care of the patient were reviewed by me and considered in my medical decision making (see chart for details).  Patient presents after what sounds like a non-syncopal fall at home with a head injury however as it was unwitnessed we cannot definitively verify that. She has a compression fracture, a great deal of pain from it. I tried Tylenol to see if that was sufficient patient is unable to walk without. We will advance her pain medications here although I feel at her age, given this compression fracture and the degree of discomfort she  is having and also according to family the likely problems that she is going to have a narcotics that she will benefit from admission for pain control and further assessment. ____________________________________________   FINAL CLINICAL IMPRESSION(S) / ED DIAGNOSES  Final diagnoses:  None      This chart was dictated using voice recognition software.  Despite best efforts to proofread,  errors can occur which can change meaning.     Jeanmarie PlantJames A Evalyn Shultis, MD 06/28/15 [phone]

## 2015-06-28 NOTE — Care Management Obs Status (Signed)
MEDICARE OBSERVATION STATUS NOTIFICATION   Patient Details  Name: Angela CongoJeliben B Schiraldi MRN: [id number] Date of Birth: Sep 01, 1921   Medicare Observation Status Notification Given:  Yes    Berna BueCheryl Darshay Deupree, RN 06/28/2015, 8:12 AM

## 2015-06-28 NOTE — Evaluation (Signed)
Physical Therapy Evaluation Patient Details Name: Angela Vargas MRN: [id number] DOB: [birth year] Today's Date: 06/28/2015   History of Present Illness  Angela Vargas is a 82 y.o.(family reports actual age is around 96/97 but specific DOB unknown) female with a known history of hypertension, diabetes, coronary artery disease status post multiple stents (family denies history of CABG). Pt lives independent life with her family and able to walk with a walker. Early this morning morning at 1:00 she got up to go to the bathroom and accidentally fall down and since then having pain in her lower back so brought to the emergency room by family. Pt was found to have T 12 vertebral compression fracture. Due to pain she was not able to walk and was admitted under observation by hospitalists. Son and daughter-in-law present for PT evaluation. They report that pt has been using a rolling walker for the last few months but was previously only using single point cane for ambulation. She took a trip to [place] to visit family in January and since her return has been using a rolling walker. Family reports that pt frequently falls but typically is not injured. They report approximately 8 falls over the last 12 months. Pt has 24/7 care from family.   Clinical Impression  Pt requires assistance of family to translate for PT evaluation making details somewhat challenging to obtain. Per RN interpreter arrived earlier in the day however he was unable to be of assistance due to specific dialect that patient speaks. Pt demonstrates need for assistance with sit to supine bed mobility due to increased pain. Educated in log-rolling technique to minimize spinal flexion. She demonstrates fair LE strength with CGA only for sit to stand and for ambulation around RN station. Pt reports increase in pain from 7/10 at rest to 10/10 with ambulation however tolerates pain well and is able to complete a full lap around RN station of her own  motivation. Pt has 24/7 assistance at home and will be safe to discharge home when medically stable. She is still awaiting orthopedic consult. Pt should utilize rolling walker at all times due to history of frequent falls and current compression fracture with scalp laceration. Recommend HH PT however family refuses due to failed intervention within the past month. Pt with little motivation to participate with [facility] PT. Family would like to discuss resources for [facility] RN. They state that MD ordered [facility] RN within the last 1.5 weeks but they were never contacted. Pt will benefit from skilled PT services to address deficits in strength, balance, and mobility in order to return to full function at home.     Follow Up Recommendations Home health PT;Other (comment) (Family currently refuses, pt refused PT recently)    Equipment Recommendations  None recommended by PT    Recommendations for Other Services       Precautions / Restrictions Precautions Precautions: Fall Restrictions Weight Bearing Restrictions: No      Mobility  Bed Mobility Overal bed mobility: Needs Assistance Bed Mobility: Sit to Supine       Sit to supine: Mod assist   General bed mobility comments: Pt requires modA+1 to go from sitting to L sidelying and then supine. Education and instruction provided to minimize spinal flexion secondary to compression fracture. Difficulty to communicate instructions due to language barrier. Bed mobility limited due to pain. Pt received upright at EOB so supine to sit not assessed  Transfers Overall transfer level: Needs assistance Equipment used: Rolling walker (2 wheeled) Transfers:  Sit to/from Stand Sit to Stand: Min guard         General transfer comment: Overall pt demonstrates functional LE strength to perform sit to stand transfer without assistance. Pt requiring repeated cues for safe hand placement and generally moves slow due to increased pain. No evidence for instability of  balance with transfers  Ambulation/Gait Ambulation/Gait assistance: Min guard Ambulation Distance (Feet): 220 Feet Assistive device: Rolling walker (2 wheeled) Gait Pattern/deviations: Decreased step length - right;Decreased step length - left Gait velocity: Decreased Gait velocity interpretation: <1.8 ft/sec, indicative of risk for recurrent falls General Gait Details: Pt ambulates with therapist for full lap around RN station. Pain increases to 10/10 however pt would like to continue walking. Vitals monitored during ambulation intermittently and remain WNL. Pt provided cues for navigating walker and to avoid bumping objects. Gait is slow but functional for limited household ambulation.  Stairs            Wheelchair Mobility    Modified Rankin (Stroke Patients Only)       Balance Overall balance assessment: Needs assistance Sitting-balance support: No upper extremity supported Sitting balance-Leahy Scale: Good     Standing balance support: During functional activity Standing balance-Leahy Scale: Fair Standing balance comment: Attempted balance assessment but difficult due to language barriers. Attempted ambulation without assistive device however pt immediately reaches for counter and bed for UE support. Pt appears to have balance deficits in standing without UE support                             Pertinent Vitals/Pain Pain Assessment: 0-10 Pain Score: 7  Pain Location: Lower back Pain Intervention(s): Limited activity within patient's tolerance;Monitored during session;Premedicated before session    Home Living Family/patient expects to be discharged to:: Private residence Living Arrangements: Children;Other relatives (grandchildren) Available Help at Discharge: Family;Available 24 hours/day Type of Home: Apartment Home Access: Stairs to enter Entrance Stairs-Rails: None (Can hold door frame, family assists with steps) Entrance Stairs-Number of Steps:  1 Home Layout: One level Home Equipment: Walker - 2 wheels;Cane - single point;Bedside commode;Shower seat;Grab bars - tub/shower      Prior Function Level of Independence: Needs assistance   Gait / Transfers Assistance Needed: Uses rolling walker for household ambulation  ADL's / Homemaking Assistance Needed: Previously independent but recently needing assistance        Hand Dominance   Dominant Hand: Right    Extremity/Trunk Assessment   Upper Extremity Assessment: Overall WFL for tasks assessed;Generalized weakness           Lower Extremity Assessment: Generalized weakness;Overall WFL for tasks assessed         Communication   Communication: Prefers language other than [place];Interpreter utilized (No interpreter available for dialect. Family interprets)  Cognition Arousal/Alertness: Awake/alert Behavior During Therapy: WFL for tasks assessed/performed Overall Cognitive Status: Within Functional Limits for tasks assessed                      General Comments      Exercises        Assessment/Plan    PT Assessment Patient needs continued PT services  PT Diagnosis Difficulty walking;Abnormality of gait;Generalized weakness;Acute pain   PT Problem List Decreased strength;Decreased range of motion;Decreased balance;Decreased mobility;Decreased safety awareness;Pain  PT Treatment Interventions DME instruction;Gait training;Stair training;Therapeutic activities;Therapeutic exercise;Balance training;Neuromuscular re-education;Patient/family education   PT Goals (Current goals can be found in the Care Plan section) Acute  Rehab PT Goals Patient Stated Goal: Family would like pt to return to prior level of function at home PT Goal Formulation: With family Time For Goal Achievement: 07/12/15 Potential to Achieve Goals: Fair    Frequency 7X/week   Barriers to discharge        Co-evaluation               End of Session Equipment Utilized During  Treatment: Gait belt Activity Tolerance: Patient tolerated treatment well;Other (comment) (reports increased pain but doesn't appear to limit) Patient left: in bed;with bed alarm set;with call bell/phone within reach;with family/visitor present Nurse Communication: Mobility status;Other (comment) (RN observes ambulation. Dining called on behalf of patient)    Functional Assessment Tool Used: clinical judgement Functional Limitation: Mobility: Walking and moving around Mobility: Walking and Moving Around Current Status [phone](G8978): At least 40 percent but less than 60 percent impaired, limited or restricted Mobility: Walking and Moving Around Goal Status [phone](G8979): At least 20 percent but less than 40 percent impaired, limited or restricted    Time: 1318-1350 PT Time Calculation (min) (ACUTE ONLY): 32 min   Charges:   PT Evaluation $PT Eval Moderate Complexity: 1 Procedure PT Treatments $Gait Training: 8-22 mins   PT G Codes:   PT G-Codes **NOT FOR INPATIENT CLASS** Functional Assessment Tool Used: clinical judgement Functional Limitation: Mobility: Walking and moving around Mobility: Walking and Moving Around Current Status ([id number](G8978): At least 40 percent but less than 60 percent impaired, limited or restricted Mobility: Walking and Moving Around Goal Status [phone](G8979): At least 20 percent but less than 40 percent impaired, limited or restricted   Lynnea MaizesJason D Johnpatrick Jenny PT, DPT   Shyler Hamill 06/28/2015, 2:09 PM

## 2015-06-29 DIAGNOSIS — E119 Type 2 diabetes mellitus without complications: Secondary | ICD-10-CM | POA: Diagnosis not present

## 2015-06-29 DIAGNOSIS — I10 Essential (primary) hypertension: Secondary | ICD-10-CM | POA: Diagnosis not present

## 2015-06-29 DIAGNOSIS — M545 Low back pain: Secondary | ICD-10-CM | POA: Diagnosis not present

## 2015-06-29 DIAGNOSIS — I2510 Atherosclerotic heart disease of native coronary artery without angina pectoris: Secondary | ICD-10-CM | POA: Diagnosis not present

## 2015-06-29 DIAGNOSIS — M8088XA Other osteoporosis with current pathological fracture, vertebra(e), initial encounter for fracture: Secondary | ICD-10-CM | POA: Diagnosis not present

## 2015-06-29 LAB — CBC
HCT: 31.8 % — ABNORMAL LOW (ref 35.0–47.0)
Hemoglobin: 10.5 g/dL — ABNORMAL LOW (ref 12.0–16.0)
MCH: 29.2 pg (ref 26.0–34.0)
MCHC: 33 g/dL (ref 32.0–36.0)
MCV: 88.3 fL (ref 80.0–100.0)
Platelets: 215 10*3/uL (ref 150–440)
RBC: 3.6 MIL/uL — ABNORMAL LOW (ref 3.80–5.20)
RDW: 17 % — AB (ref 11.5–14.5)
WBC: 7.5 10*3/uL (ref 3.6–11.0)

## 2015-06-29 LAB — BASIC METABOLIC PANEL
Anion gap: 6 (ref 5–15)
BUN: 14 mg/dL (ref 6–20)
CALCIUM: 8.4 mg/dL — AB (ref 8.9–10.3)
CO2: 26 mmol/L (ref 22–32)
Chloride: 94 mmol/L — ABNORMAL LOW (ref 101–111)
Creatinine, Ser: 1.03 mg/dL — ABNORMAL HIGH (ref 0.44–1.00)
GFR calc Af Amer: 53 mL/min — ABNORMAL LOW (ref >60)
GFR, EST NON AFRICAN AMERICAN: 46 mL/min — AB (ref >60)
GLUCOSE: 121 mg/dL — AB (ref 65–99)
Potassium: 4.9 mmol/L (ref 3.5–5.1)
Sodium: 126 mmol/L — ABNORMAL LOW (ref 135–145)

## 2015-06-29 LAB — GLUCOSE, CAPILLARY
GLUCOSE-CAPILLARY: 111 mg/dL — AB (ref 65–99)
Glucose-Capillary: 126 mg/dL — ABNORMAL HIGH (ref 65–99)

## 2015-06-29 MED ORDER — ONDANSETRON HCL 4 MG/2ML IJ SOLN
4.0000 mg | Freq: Four times a day (QID) | INTRAMUSCULAR | Status: DC | PRN
  Administered 2015-06-29: 4 mg via INTRAVENOUS
  Filled 2015-06-29: qty 2

## 2015-06-29 MED ORDER — ACETAMINOPHEN 500 MG PO TABS: 500.0000 mg | ORAL_TABLET | Freq: Four times a day (QID) | ORAL | Status: AC | PRN

## 2015-06-29 MED ORDER — IBUPROFEN 200 MG PO TABS: 200.0000 mg | ORAL_TABLET | Freq: Three times a day (TID) | ORAL | Status: AC | PRN

## 2015-06-29 MED ORDER — ACETAMINOPHEN 500 MG PO TABS
500.0000 mg | ORAL_TABLET | Freq: Four times a day (QID) | ORAL | Status: DC | PRN
  Administered 2015-06-29: 500 mg via ORAL
  Filled 2015-06-29: qty 2

## 2015-06-29 NOTE — Care Management (Signed)
Spoke with Ms. Angela Vargas and family members at the bedside. Request for hospital bed given to Will Dareen PianoAnderson, [facility] [employer] representative. Family would like to take Ms. Angela Vargas home today. Hospital bed will be delivered tomorrow.  Gwenette GreetBrenda S Shauna Bodkins RN MSN CCM Care Management [phone]

## 2015-06-29 NOTE — Progress Notes (Signed)
Pt nauseated. Vomited small amount undigested food. rn spoke with dr Esaw Grandchildvachanni. md ordered zofran 4mg  iv q6hr prn

## 2015-06-29 NOTE — Care Management (Signed)
Angela Vargas has chronic back pain  from a T 12 vertebral compression fracture which will cause undue respiratory distress, if not resolved. A hospital bed will alleviate the pain by allowing the upper body to be positioned in ways not feasible with a regular bed. Angela Vargas experiences frequent episodes of back pain which requires immediate repositioning. These frequent body changes can not be met with a regular bed. Angela Ammons RN MSN CCM Care Management [phone]

## 2015-06-29 NOTE — Progress Notes (Signed)
Pt discharged home with family. I/s family to use heating pad on medium heat prn,wear corset at all times and adm tylenol or motrin prn pain.i/s family to pick up scripts at local pharmacy. Understanding voiced. Pt with staples right scalp. I/s family do not use comb or brush, do not wash hair and make visist to local er next Thursday to have spales removed. Left via w/c with [facility]. dme to deliver hospital bed. Pt has f/u with dr Hyacinth Meekermiller in 2 weeks

## 2015-06-29 NOTE — Care Management (Signed)
Met with patient's grandson at patient bedside. They have not decided on home health agency. Please order PT, RN, SW, and NA at discharge. Thanks.

## 2015-06-29 NOTE — Care Management (Signed)
Per grandson patient has bedside commode. They would like to use [facility] for home health. Barbara CowerJason with [facility] has been notified of patient discharge. Family agrees to take patient home. No further RNCM needs. Case closed.

## 2015-06-29 NOTE — Progress Notes (Signed)
Pt with pain lower back. Will not eat . rn does not want to adm motrin on empty stomach. rn contacted dr Esaw Grandchildvachanni. md ordered 500-100mg  po tylenol q6hr prn

## 2015-07-01 DIAGNOSIS — I10 Essential (primary) hypertension: Secondary | ICD-10-CM | POA: Diagnosis not present

## 2015-07-01 DIAGNOSIS — K219 Gastro-esophageal reflux disease without esophagitis: Secondary | ICD-10-CM | POA: Diagnosis not present

## 2015-07-01 DIAGNOSIS — I272 Other secondary pulmonary hypertension: Secondary | ICD-10-CM | POA: Diagnosis not present

## 2015-07-01 DIAGNOSIS — Z95828 Presence of other vascular implants and grafts: Secondary | ICD-10-CM | POA: Diagnosis not present

## 2015-07-01 DIAGNOSIS — E119 Type 2 diabetes mellitus without complications: Secondary | ICD-10-CM | POA: Diagnosis not present

## 2015-07-01 DIAGNOSIS — M8008XD Age-related osteoporosis with current pathological fracture, vertebra(e), subsequent encounter for fracture with routine healing: Secondary | ICD-10-CM | POA: Diagnosis not present

## 2015-07-01 DIAGNOSIS — Z7902 Long term (current) use of antithrombotics/antiplatelets: Secondary | ICD-10-CM | POA: Diagnosis not present

## 2015-07-01 DIAGNOSIS — W19XXXD Unspecified fall, subsequent encounter: Secondary | ICD-10-CM | POA: Diagnosis not present

## 2015-07-01 DIAGNOSIS — S0101XD Laceration without foreign body of scalp, subsequent encounter: Secondary | ICD-10-CM | POA: Diagnosis not present

## 2015-07-01 DIAGNOSIS — Z7982 Long term (current) use of aspirin: Secondary | ICD-10-CM | POA: Diagnosis not present

## 2015-07-01 DIAGNOSIS — I2510 Atherosclerotic heart disease of native coronary artery without angina pectoris: Secondary | ICD-10-CM | POA: Diagnosis not present

## 2015-07-01 DIAGNOSIS — Z7984 Long term (current) use of oral hypoglycemic drugs: Secondary | ICD-10-CM | POA: Diagnosis not present

## 2015-07-02 ENCOUNTER — Emergency Department: Payer: Medicare Other

## 2015-07-02 ENCOUNTER — Observation Stay
Admission: EM | Admit: 2015-07-02 | Discharge: 2015-07-04 | Disposition: A | Discharge status: Home / Self Care | Payer: Medicare Other | Attending: Internal Medicine | Admitting: Internal Medicine

## 2015-07-02 DIAGNOSIS — R079 Chest pain, unspecified: Principal | ICD-10-CM | POA: Diagnosis present

## 2015-07-02 DIAGNOSIS — E785 Hyperlipidemia, unspecified: Secondary | ICD-10-CM | POA: Insufficient documentation

## 2015-07-02 DIAGNOSIS — I2510 Atherosclerotic heart disease of native coronary artery without angina pectoris: Secondary | ICD-10-CM | POA: Diagnosis not present

## 2015-07-02 DIAGNOSIS — I10 Essential (primary) hypertension: Secondary | ICD-10-CM | POA: Diagnosis not present

## 2015-07-02 DIAGNOSIS — R778 Other specified abnormalities of plasma proteins: Secondary | ICD-10-CM | POA: Insufficient documentation

## 2015-07-02 DIAGNOSIS — I509 Heart failure, unspecified: Secondary | ICD-10-CM | POA: Diagnosis not present

## 2015-07-02 DIAGNOSIS — E43 Unspecified severe protein-calorie malnutrition: Secondary | ICD-10-CM | POA: Diagnosis present

## 2015-07-02 DIAGNOSIS — Z794 Long term (current) use of insulin: Secondary | ICD-10-CM | POA: Insufficient documentation

## 2015-07-02 DIAGNOSIS — I517 Cardiomegaly: Secondary | ICD-10-CM | POA: Insufficient documentation

## 2015-07-02 DIAGNOSIS — R0602 Shortness of breath: Secondary | ICD-10-CM | POA: Diagnosis not present

## 2015-07-02 DIAGNOSIS — I252 Old myocardial infarction: Secondary | ICD-10-CM | POA: Diagnosis not present

## 2015-07-02 DIAGNOSIS — R748 Abnormal levels of other serum enzymes: Secondary | ICD-10-CM | POA: Diagnosis not present

## 2015-07-02 DIAGNOSIS — M1710 Unilateral primary osteoarthritis, unspecified knee: Secondary | ICD-10-CM | POA: Diagnosis not present

## 2015-07-02 DIAGNOSIS — R000 Tachycardia, unspecified: Secondary | ICD-10-CM | POA: Diagnosis not present

## 2015-07-02 DIAGNOSIS — M8008XA Age-related osteoporosis with current pathological fracture, vertebra(e), initial encounter for fracture: Secondary | ICD-10-CM | POA: Diagnosis present

## 2015-07-02 DIAGNOSIS — M4854XS Collapsed vertebra, not elsewhere classified, thoracic region, sequela of fracture: Secondary | ICD-10-CM | POA: Insufficient documentation

## 2015-07-02 DIAGNOSIS — R05 Cough: Secondary | ICD-10-CM | POA: Insufficient documentation

## 2015-07-02 DIAGNOSIS — Z79899 Other long term (current) drug therapy: Secondary | ICD-10-CM | POA: Diagnosis not present

## 2015-07-02 DIAGNOSIS — I272 Other secondary pulmonary hypertension: Secondary | ICD-10-CM | POA: Diagnosis not present

## 2015-07-02 DIAGNOSIS — I371 Nonrheumatic pulmonary valve insufficiency: Secondary | ICD-10-CM | POA: Diagnosis not present

## 2015-07-02 DIAGNOSIS — K219 Gastro-esophageal reflux disease without esophagitis: Secondary | ICD-10-CM | POA: Diagnosis not present

## 2015-07-02 DIAGNOSIS — E119 Type 2 diabetes mellitus without complications: Secondary | ICD-10-CM | POA: Diagnosis not present

## 2015-07-02 DIAGNOSIS — R7989 Other specified abnormal findings of blood chemistry: Secondary | ICD-10-CM | POA: Diagnosis present

## 2015-07-02 DIAGNOSIS — M8008XD Age-related osteoporosis with current pathological fracture, vertebra(e), subsequent encounter for fracture with routine healing: Secondary | ICD-10-CM | POA: Diagnosis not present

## 2015-07-02 DIAGNOSIS — E871 Hypo-osmolality and hyponatremia: Secondary | ICD-10-CM | POA: Diagnosis not present

## 2015-07-02 DIAGNOSIS — Z7982 Long term (current) use of aspirin: Secondary | ICD-10-CM | POA: Diagnosis not present

## 2015-07-02 DIAGNOSIS — I160 Hypertensive urgency: Secondary | ICD-10-CM | POA: Diagnosis not present

## 2015-07-02 DIAGNOSIS — I499 Cardiac arrhythmia, unspecified: Secondary | ICD-10-CM | POA: Diagnosis not present

## 2015-07-02 DIAGNOSIS — Z882 Allergy status to sulfonamides status: Secondary | ICD-10-CM | POA: Diagnosis not present

## 2015-07-02 DIAGNOSIS — Z8249 Family history of ischemic heart disease and other diseases of the circulatory system: Secondary | ICD-10-CM | POA: Insufficient documentation

## 2015-07-02 DIAGNOSIS — I083 Combined rheumatic disorders of mitral, aortic and tricuspid valves: Secondary | ICD-10-CM | POA: Diagnosis not present

## 2015-07-02 DIAGNOSIS — W19XXXD Unspecified fall, subsequent encounter: Secondary | ICD-10-CM | POA: Diagnosis not present

## 2015-07-02 HISTORY — DX: Pulmonary hypertension, unspecified: I27.20

## 2015-07-02 HISTORY — DX: Atherosclerotic heart disease of native coronary artery without angina pectoris: I25.10

## 2015-07-02 HISTORY — DX: Gastro-esophageal reflux disease without esophagitis: K21.9

## 2015-07-02 HISTORY — DX: Osteoarthritis of knee, unspecified: M17.9

## 2015-07-02 HISTORY — DX: Unilateral primary osteoarthritis, unspecified knee: M17.10

## 2015-07-02 LAB — CBC WITH DIFFERENTIAL/PLATELET
BASOS ABS: 0 10*3/uL (ref 0–0.1)
BASOS PCT: 0 %
Eosinophils Absolute: 0.1 10*3/uL (ref 0–0.7)
Eosinophils Relative: 1 %
HEMATOCRIT: 32.7 % — AB (ref 35.0–47.0)
HEMOGLOBIN: 10.9 g/dL — AB (ref 12.0–16.0)
LYMPHS PCT: 13 %
Lymphs Abs: 0.7 10*3/uL — ABNORMAL LOW (ref 1.0–3.6)
MCH: 29.3 pg (ref 26.0–34.0)
MCHC: 33.5 g/dL (ref 32.0–36.0)
MCV: 87.6 fL (ref 80.0–100.0)
Monocytes Absolute: 0.7 10*3/uL (ref 0.2–0.9)
Monocytes Relative: 12 %
NEUTROS ABS: 4 10*3/uL (ref 1.4–6.5)
NEUTROS PCT: 74 %
Platelets: 259 10*3/uL (ref 150–440)
RBC: 3.73 MIL/uL — AB (ref 3.80–5.20)
RDW: 17.2 % — AB (ref 11.5–14.5)
WBC: 5.4 10*3/uL (ref 3.6–11.0)

## 2015-07-02 LAB — COMPREHENSIVE METABOLIC PANEL
ALBUMIN: 3.2 g/dL — AB (ref 3.5–5.0)
ALK PHOS: 60 U/L (ref 38–126)
ALT: 10 U/L — ABNORMAL LOW (ref 14–54)
ANION GAP: 6 (ref 5–15)
AST: 21 U/L (ref 15–41)
BUN: 11 mg/dL (ref 6–20)
CALCIUM: 8.5 mg/dL — AB (ref 8.9–10.3)
CHLORIDE: 94 mmol/L — AB (ref 101–111)
CO2: 28 mmol/L (ref 22–32)
Creatinine, Ser: 0.84 mg/dL (ref 0.44–1.00)
GFR calc non Af Amer: 59 mL/min — ABNORMAL LOW (ref >60)
GLUCOSE: 132 mg/dL — AB (ref 65–99)
POTASSIUM: 4.1 mmol/L (ref 3.5–5.1)
SODIUM: 128 mmol/L — AB (ref 135–145)
Total Bilirubin: 1 mg/dL (ref 0.3–1.2)
Total Protein: 6.9 g/dL (ref 6.5–8.1)

## 2015-07-02 LAB — TROPONIN I: TROPONIN I: 0.06 ng/mL — AB (ref <0.031)

## 2015-07-02 LAB — LACTIC ACID, PLASMA: LACTIC ACID, VENOUS: 1.4 mmol/L (ref 0.5–2.0)

## 2015-07-02 LAB — BRAIN NATRIURETIC PEPTIDE: B Natriuretic Peptide: 428 pg/mL — ABNORMAL HIGH (ref 0.0–100.0)

## 2015-07-02 LAB — GLUCOSE, CAPILLARY: GLUCOSE-CAPILLARY: 99 mg/dL (ref 65–99)

## 2015-07-02 MED ORDER — MORPHINE SULFATE (PF) 2 MG/ML IV SOLN
2.0000 mg | Freq: Once | INTRAVENOUS | Status: AC
  Administered 2015-07-02: 2 mg via INTRAVENOUS

## 2015-07-02 MED ORDER — ASPIRIN EC 81 MG PO TBEC
81.0000 mg | DELAYED_RELEASE_TABLET | Freq: Every day | ORAL | Status: DC
  Administered 2015-07-04: 81 mg via ORAL
  Filled 2015-07-02 (×2): qty 1

## 2015-07-02 MED ORDER — SODIUM CHLORIDE 0.9% FLUSH
3.0000 mL | Freq: Two times a day (BID) | INTRAVENOUS | Status: DC
  Administered 2015-07-02 – 2015-07-03 (×2): 3 mL via INTRAVENOUS

## 2015-07-02 MED ORDER — IOPAMIDOL (ISOVUE-370) INJECTION 76%
75.0000 mL | Freq: Once | INTRAVENOUS | Status: AC | PRN
  Administered 2015-07-02: 75 mL via INTRAVENOUS

## 2015-07-02 MED ORDER — ENOXAPARIN SODIUM 40 MG/0.4ML ~~LOC~~ SOLN
40.0000 mg | SUBCUTANEOUS | Status: DC
  Administered 2015-07-02 – 2015-07-03 (×2): 40 mg via SUBCUTANEOUS
  Filled 2015-07-02 (×2): qty 0.4

## 2015-07-02 MED ORDER — PRAVASTATIN SODIUM 20 MG PO TABS
20.0000 mg | ORAL_TABLET | Freq: Every day | ORAL | Status: DC
  Administered 2015-07-03 – 2015-07-04 (×2): 20 mg via ORAL
  Filled 2015-07-02 (×2): qty 1

## 2015-07-02 MED ORDER — ONDANSETRON HCL 4 MG/2ML IJ SOLN: 4.0000 mg | Freq: Four times a day (QID) | INTRAMUSCULAR | Status: DC | PRN

## 2015-07-02 MED ORDER — CLOPIDOGREL BISULFATE 75 MG PO TABS
75.0000 mg | ORAL_TABLET | Freq: Every day | ORAL | Status: DC
  Administered 2015-07-04: 75 mg via ORAL
  Filled 2015-07-02 (×2): qty 1

## 2015-07-02 MED ORDER — GABAPENTIN 100 MG PO CAPS
100.0000 mg | ORAL_CAPSULE | Freq: Three times a day (TID) | ORAL | Status: DC
  Administered 2015-07-02 – 2015-07-04 (×5): 100 mg via ORAL
  Filled 2015-07-02 (×6): qty 1

## 2015-07-02 MED ORDER — CITALOPRAM HYDROBROMIDE 20 MG PO TABS
10.0000 mg | ORAL_TABLET | Freq: Every day | ORAL | Status: DC
  Administered 2015-07-02 – 2015-07-03 (×2): 10 mg via ORAL
  Filled 2015-07-02 (×2): qty 1

## 2015-07-02 MED ORDER — DOCUSATE SODIUM 100 MG PO CAPS
100.0000 mg | ORAL_CAPSULE | Freq: Two times a day (BID) | ORAL | Status: DC
  Administered 2015-07-02 – 2015-07-04 (×3): 100 mg via ORAL
  Filled 2015-07-02 (×4): qty 1

## 2015-07-02 MED ORDER — ACETAMINOPHEN 325 MG PO TABS
650.0000 mg | ORAL_TABLET | Freq: Four times a day (QID) | ORAL | Status: DC | PRN
  Administered 2015-07-03: 650 mg via ORAL
  Administered 2015-07-04: 325 mg via ORAL
  Filled 2015-07-02: qty 2
  Filled 2015-07-02: qty 1
  Filled 2015-07-02: qty 2

## 2015-07-02 MED ORDER — ACETAMINOPHEN 650 MG RE SUPP: 650.0000 mg | Freq: Four times a day (QID) | RECTAL | Status: DC | PRN

## 2015-07-02 MED ORDER — INSULIN ASPART 100 UNIT/ML ~~LOC~~ SOLN: 0.0000 [IU] | Freq: Four times a day (QID) | SUBCUTANEOUS | Status: DC

## 2015-07-02 MED ORDER — ONDANSETRON HCL 4 MG PO TABS: 4.0000 mg | ORAL_TABLET | Freq: Four times a day (QID) | ORAL | Status: DC | PRN

## 2015-07-02 MED ORDER — PANTOPRAZOLE SODIUM 40 MG PO TBEC
40.0000 mg | DELAYED_RELEASE_TABLET | Freq: Every day | ORAL | Status: DC
  Administered 2015-07-04: 40 mg via ORAL
  Filled 2015-07-02 (×2): qty 1

## 2015-07-02 MED ORDER — SODIUM CHLORIDE 0.9 % IV SOLN
Freq: Once | INTRAVENOUS | Status: AC
  Administered 2015-07-02: 19:00:00 via INTRAVENOUS

## 2015-07-02 MED ORDER — ISOSORBIDE MONONITRATE 20 MG PO TABS
20.0000 mg | ORAL_TABLET | Freq: Every day | ORAL | Status: DC
  Administered 2015-07-04: 20 mg via ORAL
  Filled 2015-07-02 (×4): qty 1

## 2015-07-02 MED ORDER — LABETALOL HCL 5 MG/ML IV SOLN: 10.0000 mg | INTRAVENOUS | Status: DC | PRN

## 2015-07-02 MED ORDER — LOSARTAN POTASSIUM 50 MG PO TABS
100.0000 mg | ORAL_TABLET | Freq: Every day | ORAL | Status: DC
  Administered 2015-07-04: 100 mg via ORAL
  Filled 2015-07-02 (×2): qty 2

## 2015-07-02 MED ORDER — MORPHINE SULFATE (PF) 2 MG/ML IV SOLN
INTRAVENOUS | Status: AC
  Filled 2015-07-02: qty 1

## 2015-07-02 NOTE — Progress Notes (Signed)
Pt's family requesting to have pt's gabapentin added so she take that while here & asking requesting a stool softner, due to pt having hard bowl movements & not going as often as she should. MD paged. Spoke with Dr. Anne HahnWillis, MD to put orders in for both. Will continue to monitor. Shirley FriarAlexis Miller, RN

## 2015-07-02 NOTE — ED Notes (Signed)
Report form jerri, rn

## 2015-07-02 NOTE — ED Notes (Signed)
Pt appears uncomfortable, but does not understand number scale. Pt's family states "i think she's in pain." spoke with dr. Anne HahnWillis who orders morphine.

## 2015-07-02 NOTE — ED Notes (Signed)
Warm blankets provided to pt, call bell at side. Warm blankets and snacks provided to family per family request. Explanation of ct procedure explained to family who verbalizes understanding. Pt resting with eyes closed, resps unlabored, skin normal color warm and dry.

## 2015-07-02 NOTE — H&P (Signed)
[facility] - [place] at [facility]   PATIENT NAME: Angela Vargas    MR#:  [id number]  DATE OF BIRTH:  [birth year]  DATE OF ADMISSION:  07/02/2015  PRIMARY CARE PHYSICIAN: Lyndon Code, MD   REQUESTING/REFERRING PHYSICIAN: Darnelle Catalan, MD  CHIEF COMPLAINT:   Chief Complaint  Patient presents with  . Tachycardia    HISTORY OF PRESENT ILLNESS:  Angela Vargas  is a 82 y.o. female who presents with chest pain. Patient does not speak in this very well, family states that they prefer to interpret for her. She comes in today for an episode of chest pain. On chart review looks like she has been seen multiple times in the past at [facility] for the same. She does have a history of CAD and prior MI. She also has a history of pulmonary hypertension, as well as essential systemic hypertension. Family corroborates all of this history taken from chart review. She states that she began to develop chest pain today associated with some shortness of breath while at rest. She was brought to the ED for evaluation. She was recently admitted here for vertebral compression fracture. On evaluation in the ED today, chest x-ray is clear, and subsequent CTA chest shows no PE or pulmonary edema. CT does show a nodule in her upper lung, and is recommended this be reevaluated with CT in 6 months. Her troponin is mildly elevated at 0.06, and her BNP is also somewhat elevated in the 400s. He has no prior diagnosis of heart failure. She is significantly hypertensive in the ED today. Hospitalists were called for admission for all the above.   PAST MEDICAL HISTORY:   Past Medical History  Diagnosis Date  . Diabetes mellitus without complication (HCC)   . Myocardial infarct (HCC)   . Hypertension   . Compression fracture of T12 vertebra (HCC)   . Pulmonary hypertension (HCC)   . CAD (coronary artery disease)   . Knee osteoarthritis   . GERD (gastroesophageal reflux disease)     PAST SURGICAL  HISTORY:   Past Surgical History  Procedure Laterality Date  . Stent placement iliac ([facility] hx)      SOCIAL HISTORY:   Social History  Substance Use Topics  . Smoking status: Never Smoker   . Smokeless tobacco: Not on file  . Alcohol Use: No    FAMILY HISTORY:   Family History  Problem Relation Age of Onset  . Hypertension Father     DRUG ALLERGIES:   Allergies  Allergen Reactions  . Sulfa Antibiotics     unknown    MEDICATIONS AT HOME:   Prior to Admission medications   Medication Sig Start Date End Date Taking? Authorizing Provider  acetaminophen (TYLENOL) 500 MG tablet Take 1-2 tablets (500-1,000 mg total) by mouth every 6 (six) hours as needed for moderate pain. 06/29/15  Yes Altamese Dilling, MD  aspirin EC 81 MG tablet Take 81 mg by mouth daily.   Yes Historical Provider, MD  Calcium Carbonate-Vitamin D (CALCIUM 600+D) 600-200 MG-UNIT TABS Take 1 tablet by mouth daily.   Yes Historical Provider, MD  citalopram (CELEXA) 10 MG tablet Take 10 mg by mouth at bedtime.    Yes Historical Provider, MD  clopidogrel (PLAVIX) 75 MG tablet Take 75 mg by mouth daily.    Yes Historical Provider, MD  Cranberry 1000 MG CAPS Take 1,000 mg by mouth daily.   Yes Historical Provider, MD  cyanocobalamin (,VITAMIN B-12,) 1000 MCG/ML injection Inject 1,000 mcg  into the muscle every 30 (thirty) days.   Yes Historical Provider, MD  folic acid (FOLVITE) 1 MG tablet Take 1 mg by mouth daily.    Yes Historical Provider, MD  gabapentin (NEURONTIN) 100 MG capsule Take 100 mg by mouth 3 (three) times daily.   Yes Historical Provider, MD  ibuprofen (MOTRIN IB) 200 MG tablet Take 1 tablet (200 mg total) by mouth every 8 (eight) hours as needed for moderate pain or cramping. 06/29/15  Yes Altamese DillingVaibhavkumar Vachhani, MD  isosorbide mononitrate (ISMO,MONOKET) 20 MG tablet Take 20 mg by mouth daily.    Yes Historical Provider, MD  losartan (COZAAR) 100 MG tablet Take 100 mg by mouth daily.    Yes  Historical Provider, MD  lovastatin (MEVACOR) 20 MG tablet Take 20 mg by mouth at bedtime.    Yes Historical Provider, MD  metFORMIN (GLUCOPHAGE) 500 MG tablet Take 500 mg by mouth 3 (three) times daily.    Yes Historical Provider, MD  nitrofurantoin, macrocrystal-monohydrate, (MACROBID) 100 MG capsule Take 100 mg by mouth every Monday, Wednesday, and Friday.    Yes Historical Provider, MD  omeprazole (PRILOSEC) 20 MG capsule Take 20 mg by mouth daily.   Yes Historical Provider, MD  ondansetron (ZOFRAN ODT) 4 MG disintegrating tablet Take 1 tablet (4 mg total) by mouth every 8 (eight) hours as needed for nausea or vomiting. 06/15/15  Yes Emily FilbertJonathan E Williams, MD  pioglitazone (ACTOS) 15 MG tablet Take 15 mg by mouth daily.    Yes Historical Provider, MD    REVIEW OF SYSTEMS:  Review of Systems  Constitutional: Negative for fever, chills, weight loss and malaise/fatigue.  HENT: Negative for ear pain, hearing loss and tinnitus.   Eyes: Negative for blurred vision, double vision, pain and redness.  Respiratory: Positive for shortness of breath. Negative for cough and hemoptysis.   Cardiovascular: Positive for chest pain. Negative for palpitations, orthopnea and leg swelling.  Gastrointestinal: Negative for nausea, vomiting, abdominal pain, diarrhea and constipation.  Genitourinary: Negative for dysuria, frequency and hematuria.  Musculoskeletal: Negative for back pain, joint pain and neck pain.  Skin:       No acne, rash, or lesions  Neurological: Negative for dizziness, tremors, focal weakness and weakness.  Endo/Heme/Allergies: Negative for polydipsia. Does not bruise/bleed easily.  Psychiatric/Behavioral: Negative for depression. The patient is not nervous/anxious and does not have insomnia.      VITAL SIGNS:   Filed Vitals:   07/02/15 1900 07/02/15 1915 07/02/15 1930 07/02/15 2010  BP: 166/81  175/92 149/92  Pulse: 90 90 91   Temp:      TempSrc:      Resp: 19 19 18    Height:       Weight:      SpO2: 99% 99% 100%    Wt Readings from Last 3 Encounters:  07/02/15 54.885 kg (121 lb)  06/28/15 55.067 kg (121 lb 6.4 oz)  06/15/15 57.607 kg (127 lb)    PHYSICAL EXAMINATION:  Physical Exam  Vitals reviewed. Constitutional: She is oriented to person, place, and time. She appears well-developed and well-nourished. No distress.  HENT:  Head: Normocephalic and atraumatic.  Mouth/Throat: Oropharynx is clear and moist.  Eyes: Conjunctivae and EOM are normal. Pupils are equal, round, and reactive to light. No scleral icterus.  Neck: Normal range of motion. Neck supple. No JVD present. No thyromegaly present.  Cardiovascular: Regular rhythm and intact distal pulses.  Exam reveals no gallop and no friction rub.   No murmur heard.  Borderline tachycardic  Respiratory: Effort normal and breath sounds normal. No respiratory distress. She has no wheezes. She has no rales.  GI: Soft. Bowel sounds are normal. She exhibits no distension. There is no tenderness.  Musculoskeletal: Normal range of motion. She exhibits no edema.  No arthritis, no gout  Lymphadenopathy:    She has no cervical adenopathy.  Neurological: She is alert and oriented to person, place, and time. No cranial nerve deficit.  No dysarthria, no aphasia  Skin: Skin is warm and dry. No rash noted. No erythema.  Psychiatric:  Unable to fully assess due to language barrier    LABORATORY PANEL:   CBC  Recent Labs Lab 07/02/15 1842  WBC 5.4  HGB 10.9*  HCT 32.7*  PLT 259   ------------------------------------------------------------------------------------------------------------------  Chemistries   Recent Labs Lab 07/02/15 1842  NA 128*  K 4.1  CL 94*  CO2 28  GLUCOSE 132*  BUN 11  CREATININE 0.84  CALCIUM 8.5*  AST 21  ALT 10*  ALKPHOS 60  BILITOT 1.0   ------------------------------------------------------------------------------------------------------------------  Cardiac  Enzymes  Recent Labs Lab 07/02/15 1842  TROPONINI 0.06*   ------------------------------------------------------------------------------------------------------------------  RADIOLOGY:  Ct Angio Chest Pe W/cm &/or Wo Cm  07/02/2015  CLINICAL DATA:  Pleuritic chest pain and tachycardia. Concern pulmonary embolism. EXAM: CT ANGIOGRAPHY CHEST WITH CONTRAST TECHNIQUE: Multidetector CT imaging of the chest was performed using the standard protocol during bolus administration of intravenous contrast. Multiplanar CT image reconstructions and MIPs were obtained to evaluate the vascular anatomy. CONTRAST:  Seventy-five cc Isovue COMPARISON:  07/02/2015, chest radiograph, lumbar radiograph 06/28/2015, CT abdomen 09/05/2013 FINDINGS: Mediastinum/Nodes: No filling defects within pulmonary suggest acute pulmonary embolism. No acute findings aorta great vessels. Coronary calcifications are present. No pericardial fluid. Normal esophagus. Lungs/Pleura: No pulmonary infarction. Calcified granuloma in the LEFT upper lobe measures 8 mm on image 34, series 6. More superior noncalcified nodule mean 6 mm (image 27, series 6). There is bibasilar atelectasis. Limited view of the liver, kidneys, pancreas are unremarkable. Normal adrenal glands. Upper abdomen: No aggressive osseous lesion. Musculoskeletal: No aggressive osseous lesion. Severe compression fracture at T12 with approximately 50% loss of vertebral body height. There is mild retropulsion which indents the ventral thecal sac. Finding present on radiograph 06/28/2015 and new from CT of 2015. There is mild stranding and gas within the soft tissues about the fracture which suggests fracture was acute on the radiograph of 06/28/2015. Review of the MIP images confirms the above findings. IMPRESSION: 1. No acute pulmonary embolism. 2. Subacute severe compression fracture at T12 with mild retropulsion. 3. A small LEFT upper lobe pulmonary nodule. Non-contrast chest CT at 6-12  months is recommended. If the nodule is stable at time of repeat CT, then future CT at 18-24 months (from today's scan) is considered optional for low-risk patients, but is recommended for high-risk patients. This recommendation follows the consensus statement: Guidelines for Management of Incidental Pulmonary Nodules Detected on CT Images:From the Fleischner Society 2017; published online before print (10.1148/radiol.[id number]). Electronically Signed   By: Genevive Bi M.D.   On: 07/02/2015 20:24   Dg Chest Portable 1 View  07/02/2015  CLINICAL DATA:  Tachycardia EXAM: PORTABLE CHEST 1 VIEW COMPARISON:  06/15/2015 FINDINGS: Cardiomegaly again noted. No infiltrate or pulmonary edema. Calcified granuloma in left upper lobe again noted. Persistent somewhat spiculated nodule in left upper lobe measures about 5.3 cm without significant change from prior exam. Further correlation with CT scan of the chest is again recommended. IMPRESSION: No  infiltrate or pulmonary edema. Calcified granuloma in left upper lobe again noted. Persistent somewhat spiculated nodule in left upper lobe measures about 5.3 cm without significant change from prior exam. Further correlation with CT scan of the chest is again recommended. Electronically Signed   By: Natasha Mead M.D.   On: 07/02/2015 18:41    EKG:   Orders placed or performed during the hospital encounter of 07/02/15  . EKG 12-Lead  . EKG 12-Lead    IMPRESSION AND PLAN:  Principal Problem:   Chest pain - suspect some demand ischemia due to her elevated blood pressure, perhaps causing some mild heart failure as well. However, given her cardiac history we will admit her to telemetry and trend her cardiac enzymes. We'll also get a cardiology consult and echocardiogram. Active Problems:   Acute CHF (congestive heart failure) (HCC) - without pulmonary edema. No need for diuresis urgently. Echocardiogram and cardiology consult as above.    Type 2 diabetes mellitus  (HCC) - sliding scale insulin with glucose checks every 6 hours while nothing by mouth. We'll keep her nothing by mouth through the night on the chance that there is any need a cardiac procedure tomorrow.   HTN (hypertension) - we'll continue home meds, as well as additional when necessary antihypertensives to keep her blood pressure less than 160/100.   CAD (coronary artery disease) - continue home meds for this   Vertebral fracture, osteoporotic (HCC) - not complaining about this currently, treat pain when necessary   GERD (gastroesophageal reflux disease) - home dose PPI  All the records are reviewed and case discussed with ED provider. Management plans discussed with the patient and/or family.  DVT PROPHYLAXIS: SubQ lovenox  GI PROPHYLAXIS: PPI  ADMISSION STATUS: Observation  CODE STATUS: Full Code Status History    Date Active Date Inactive Code Status Order ID Comments User Context   06/28/2015  9:18 AM 06/29/2015  6:28 PM Full Code [id number]  Altamese Dilling, MD Inpatient      TOTAL TIME TAKING CARE OF THIS PATIENT: 45 minutes.    Andron Marrazzo FIELDING 07/02/2015, 8:52 PM  [facility]  Office  [phone]  CC: Primary care physician; Lyndon Code, MD

## 2015-07-02 NOTE — ED Notes (Signed)
md in to assess for admission.

## 2015-07-02 NOTE — ED Notes (Signed)
Pt arrived from home via EMS c/o tachycardia. Per EMS pt receives home health for previous fall, home health nurse noted yesterday pt had a 107 HR and today 110 HR. After consulting with primary care, nurse was directed to send pt to emergency room for follow up care.

## 2015-07-02 NOTE — ED Provider Notes (Signed)
[facility] Emergency Department Provider Note  ____________________________________________  Time seen: Approximately 5:38 PM  I have reviewed the triage vital signs and the nursing notes.   HISTORY  Chief Complaint Tachycardia    HPI Angela Vargas is a 82 y.o. female sent here from home by home health nurse. Patient was tachycardic yesterday to 107 up to 110 today. Patient feels warm and has a cough that she is obtained through family who interprets she speaks in the dialect. Family wants to interpret. Patient also complains of pleuritic chest pain across the bottom of her chest now. Uncertain when that started. Pain is moderate.   Past Medical History  Diagnosis Date  . Diabetes mellitus without complication (HCC)   . Myocardial infarct (HCC)   . Hypertension   . Compression fracture of T12 vertebra (HCC)   . Pulmonary hypertension (HCC)   . CAD (coronary artery disease)   . Knee osteoarthritis   . GERD (gastroesophageal reflux disease)     Patient Active Problem List   Diagnosis Date Noted  . Chest pain 07/02/2015  . Acute CHF (congestive heart failure) (HCC) 07/02/2015  . GERD (gastroesophageal reflux disease) 07/02/2015  . Type 2 diabetes mellitus (HCC) 07/02/2015  . HTN (hypertension) 07/02/2015  . CAD (coronary artery disease) 07/02/2015  . Vertebral fracture, osteoporotic (HCC) 06/28/2015    Past Surgical History  Procedure Laterality Date  . Stent placement iliac ([facility] hx)      Current Outpatient Rx  Name  Route  Sig  Dispense  Refill  . acetaminophen (TYLENOL) 500 MG tablet   Oral   Take 1-2 tablets (500-1,000 mg total) by mouth every 6 (six) hours as needed for moderate pain.   30 tablet   0   . aspirin EC 81 MG tablet   Oral   Take 81 mg by mouth daily.         . Calcium Carbonate-Vitamin D (CALCIUM 600+D) 600-200 MG-UNIT TABS   Oral   Take 1 tablet by mouth daily.         . citalopram (CELEXA) 10 MG tablet  Oral   Take 10 mg by mouth at bedtime.       3   . clopidogrel (PLAVIX) 75 MG tablet   Oral   Take 75 mg by mouth daily.       3   . Cranberry 1000 MG CAPS   Oral   Take 1,000 mg by mouth daily.         . cyanocobalamin (,VITAMIN B-12,) 1000 MCG/ML injection   Intramuscular   Inject 1,000 mcg into the muscle every 30 (thirty) days.         . folic acid (FOLVITE) 1 MG tablet   Oral   Take 1 mg by mouth daily.       3   . gabapentin (NEURONTIN) 100 MG capsule   Oral   Take 100 mg by mouth 3 (three) times daily.         Marland Kitchen. ibuprofen (MOTRIN IB) 200 MG tablet   Oral   Take 1 tablet (200 mg total) by mouth every 8 (eight) hours as needed for moderate pain or cramping.   30 tablet   0   . isosorbide mononitrate (ISMO,MONOKET) 20 MG tablet   Oral   Take 20 mg by mouth daily.       3   . losartan (COZAAR) 100 MG tablet   Oral   Take 100 mg by mouth  daily.       3   . lovastatin (MEVACOR) 20 MG tablet   Oral   Take 20 mg by mouth at bedtime.       3   . metFORMIN (GLUCOPHAGE) 500 MG tablet   Oral   Take 500 mg by mouth 3 (three) times daily.       3   . nitrofurantoin, macrocrystal-monohydrate, (MACROBID) 100 MG capsule   Oral   Take 100 mg by mouth every Monday, Wednesday, and Friday.          Marland Kitchen omeprazole (PRILOSEC) 20 MG capsule   Oral   Take 20 mg by mouth daily.         . ondansetron (ZOFRAN ODT) 4 MG disintegrating tablet   Oral   Take 1 tablet (4 mg total) by mouth every 8 (eight) hours as needed for nausea or vomiting.   20 tablet   0   . pioglitazone (ACTOS) 15 MG tablet   Oral   Take 15 mg by mouth daily.       2     Allergies Sulfa antibiotics  Family History  Problem Relation Age of Onset  . Hypertension Father     Social History Social History  Substance Use Topics  . Smoking status: Never Smoker   . Smokeless tobacco: None  . Alcohol Use: No    Review of Systems Constitutional: See history of present  illness Eyes: No visual changes. ENT: No sore throat. Cardiovascular: Denies chest pain. Respiratory: Denies shortness of breath. Gastrointestinal: No abdominal pain.  No nausea, no vomiting.  No diarrhea.  No constipation. Genitourinary: Negative for dysuria. Musculoskeletal: Negative for back pain. Skin: Negative for rash. Neurological: Negative for focal weakness or numbness.  10-point ROS otherwise negative.  ____________________________________________   PHYSICAL EXAM:  VITAL SIGNS: ED Triage Vitals  Enc Vitals Group     BP 07/02/15 1707 153/83 mmHg     Pulse Rate 07/02/15 1707 94     Resp 07/02/15 1707 18     Temp 07/02/15 1707 98.9 F (37.2 C)     Temp Source 07/02/15 1707 Oral     SpO2 07/02/15 1703 94 %     Weight 07/02/15 1707 121 lb (54.885 kg)     Height 07/02/15 1707 5' (1.524 m)     Head Cir --      Peak Flow --      Pain Score 07/02/15 1711 9     Pain Loc --      Pain Edu? --      Excl. in GC? --    Constitutional: Alert and oriented. Well appearing and in no acute distress. Eyes: Conjunctivae are normal. PERRL. EOMI. Head: Atraumatic.Except for where she fell and hit the back of her head. Nose: No congestion/rhinnorhea. Mouth/Throat: Mucous membranes are moist.  Oropharynx non-erythematous. Neck: No stridor.  Cardiovascular: Normal rate, regular rhythm. Grossly normal heart sounds.  Good peripheral circulation. Respiratory: Normal respiratory effort.  No retractions. Lungs CTAB. Gastrointestinal: Soft and nontender. No distention. No abdominal bruits. No CVA tenderness. Musculoskeletal: No lower extremity tenderness nor edema.  No joint effusions. Neurologic:  Normal speech and language. No gross focal neurologic deficits are appreciated.  Skin:  Skin is warm, dry and intact. No rash noted. Psychiatric: Mood and affect are normal. Speech and behavior are normal.  ____________________________________________   LABS (all labs ordered are listed, but  only abnormal results are displayed)  Labs Reviewed  COMPREHENSIVE METABOLIC PANEL -  Abnormal; Notable for the following:    Sodium 128 (*)    Chloride 94 (*)    Glucose, Bld 132 (*)    Calcium 8.5 (*)    Albumin 3.2 (*)    ALT 10 (*)    GFR calc non Af Amer 59 (*)    All other components within normal limits  BRAIN NATRIURETIC PEPTIDE - Abnormal; Notable for the following:    B Natriuretic Peptide 428.0 (*)    All other components within normal limits  TROPONIN I - Abnormal; Notable for the following:    Troponin I 0.06 (*)    All other components within normal limits  CBC WITH DIFFERENTIAL/PLATELET - Abnormal; Notable for the following:    RBC 3.73 (*)    Hemoglobin 10.9 (*)    HCT 32.7 (*)    RDW 17.2 (*)    Lymphs Abs 0.7 (*)    All other components within normal limits  LACTIC ACID, PLASMA  LACTIC ACID, PLASMA   ____________________________________________  EKG  EKG read and interpreted by me shows normal sinus rhythm rate of 88 normal axis no acute ST-T wave changes ____________________________________________  RADIOLOGY  Chest x-ray shows no acute disease per radiology CT shows a pulmonary nodule and this fracture of T12 with some retropulsion which was originally diagnosed on the 20th of this month. ____________________________________________   PROCEDURES  Proc  ____________________________________________   INITIAL IMPRESSION / ASSESSMENT AND PLAN / ED COURSE  Pertinent labs & imaging results that were available during my care of the patient were reviewed by me and considered in my medical decision making (see chart for details).   ____________________________________________   FINAL CLINICAL IMPRESSION(S) / ED DIAGNOSES  Final diagnoses:  Hyponatremia  Elevated troponin    Another diagnosis is subacute compression fracture T12  Arnaldo Natal, MD 07/02/15 2041

## 2015-07-02 NOTE — ED Notes (Signed)
Dr Darnelle CatalanMalinda notified troponin 0.06.

## 2015-07-02 NOTE — ED Notes (Signed)
Patient transported to CT 

## 2015-07-03 ENCOUNTER — Observation Stay
Admit: 2015-07-03 | Discharge: 2015-07-03 | Disposition: A | Discharge status: Home / Self Care | Payer: Medicare Other | Attending: Cardiovascular Disease | Admitting: Cardiovascular Disease

## 2015-07-03 DIAGNOSIS — I160 Hypertensive urgency: Secondary | ICD-10-CM | POA: Diagnosis not present

## 2015-07-03 DIAGNOSIS — R7989 Other specified abnormal findings of blood chemistry: Secondary | ICD-10-CM | POA: Diagnosis not present

## 2015-07-03 DIAGNOSIS — R079 Chest pain, unspecified: Secondary | ICD-10-CM | POA: Diagnosis not present

## 2015-07-03 DIAGNOSIS — I10 Essential (primary) hypertension: Secondary | ICD-10-CM | POA: Diagnosis not present

## 2015-07-03 DIAGNOSIS — K219 Gastro-esophageal reflux disease without esophagitis: Secondary | ICD-10-CM | POA: Diagnosis not present

## 2015-07-03 DIAGNOSIS — E119 Type 2 diabetes mellitus without complications: Secondary | ICD-10-CM | POA: Diagnosis not present

## 2015-07-03 LAB — BASIC METABOLIC PANEL
ANION GAP: 5 (ref 5–15)
BUN: 10 mg/dL (ref 6–20)
CHLORIDE: 96 mmol/L — AB (ref 101–111)
CO2: 27 mmol/L (ref 22–32)
CREATININE: 0.76 mg/dL (ref 0.44–1.00)
Calcium: 8.3 mg/dL — ABNORMAL LOW (ref 8.9–10.3)
GFR calc non Af Amer: >60 mL/min (ref >60)
Glucose, Bld: 168 mg/dL — ABNORMAL HIGH (ref 65–99)
Potassium: 3.8 mmol/L (ref 3.5–5.1)
SODIUM: 128 mmol/L — AB (ref 135–145)

## 2015-07-03 LAB — CBC
HCT: 30.5 % — ABNORMAL LOW (ref 35.0–47.0)
HEMOGLOBIN: 10.2 g/dL — AB (ref 12.0–16.0)
MCH: 29 pg (ref 26.0–34.0)
MCHC: 33.5 g/dL (ref 32.0–36.0)
MCV: 86.4 fL (ref 80.0–100.0)
PLATELETS: 253 10*3/uL (ref 150–440)
RBC: 3.52 MIL/uL — AB (ref 3.80–5.20)
RDW: 16.7 % — ABNORMAL HIGH (ref 11.5–14.5)
WBC: 4.5 10*3/uL (ref 3.6–11.0)

## 2015-07-03 LAB — ECHOCARDIOGRAM COMPLETE
Height: 60 in
WEIGHTICAEL: 1908.8 [oz_av]

## 2015-07-03 LAB — TROPONIN I
Troponin I: 0.03 ng/mL (ref <0.031)
Troponin I: 0.04 ng/mL — ABNORMAL HIGH (ref <0.031)
Troponin I: 0.03 ng/mL (ref <0.031)

## 2015-07-03 LAB — HEMOGLOBIN A1C: HEMOGLOBIN A1C: 6.7 % — AB (ref 4.0–6.0)

## 2015-07-03 LAB — GLUCOSE, CAPILLARY
GLUCOSE-CAPILLARY: 107 mg/dL — AB (ref 65–99)
Glucose-Capillary: 144 mg/dL — ABNORMAL HIGH (ref 65–99)
Glucose-Capillary: 214 mg/dL — ABNORMAL HIGH (ref 65–99)

## 2015-07-03 MED ORDER — ENSURE ENLIVE PO LIQD: 237.0000 mL | Freq: Two times a day (BID) | ORAL | Status: DC

## 2015-07-03 MED ORDER — MORPHINE SULFATE (PF) 2 MG/ML IV SOLN: 2.0000 mg | INTRAVENOUS | Status: DC | PRN

## 2015-07-03 MED ORDER — METOPROLOL TARTRATE 25 MG PO TABS
25.0000 mg | ORAL_TABLET | Freq: Two times a day (BID) | ORAL | Status: DC
  Administered 2015-07-03 – 2015-07-04 (×2): 25 mg via ORAL
  Filled 2015-07-03 (×3): qty 1

## 2015-07-03 MED ORDER — INSULIN ASPART 100 UNIT/ML ~~LOC~~ SOLN
0.0000 [IU] | Freq: Four times a day (QID) | SUBCUTANEOUS | Status: DC
  Administered 2015-07-03: 1 [IU] via SUBCUTANEOUS
  Administered 2015-07-03: 3 [IU] via SUBCUTANEOUS
  Administered 2015-07-04: 2 [IU] via SUBCUTANEOUS
  Filled 2015-07-03: qty 3
  Filled 2015-07-03: qty 1

## 2015-07-03 NOTE — Care Management Obs Status (Signed)
MEDICARE OBSERVATION STATUS NOTIFICATION   Patient Details  Name: Angela Vargas MRN: [id number] Date of Birth: 1921-06-11   Medicare Observation Status Notification Given:  Yes (chest pain)    Caren MacadamMichelle Twinkle Sockwell, RN 07/03/2015, 3:53 PM

## 2015-07-03 NOTE — Progress Notes (Signed)
Angela Vargas is a 82 y.o. female  [id number]  Primary Cardiologist: Adrian BlackwaterShaukat Tyvon Eggenberger Reason for Consultation: Chest pain  HPI: This is a 82 year old [place] female who presented to the hospital with chest pain and back pain. According to the daughter she fell and had back pain and laceration on the back of head which needed stitches last week. Home health nurse came and noticed that she was tachycardic and was sent to the hospital because because of chest pain.    Review of Systems: No orthopnea PND or leg swelling   Past Medical History  Diagnosis Date  . Diabetes mellitus without complication (HCC)   . Myocardial infarct (HCC)   . Hypertension   . Compression fracture of T12 vertebra (HCC)   . Pulmonary hypertension (HCC)   . CAD (coronary artery disease)   . Knee osteoarthritis   . GERD (gastroesophageal reflux disease)     Medications Prior to Admission  Medication Sig Dispense Refill  . acetaminophen (TYLENOL) 500 MG tablet Take 1-2 tablets (500-1,000 mg total) by mouth every 6 (six) hours as needed for moderate pain. 30 tablet 0  . aspirin EC 81 MG tablet Take 81 mg by mouth daily.    . Calcium Carbonate-Vitamin D (CALCIUM 600+D) 600-200 MG-UNIT TABS Take 1 tablet by mouth daily.    . citalopram (CELEXA) 10 MG tablet Take 10 mg by mouth at bedtime.   3  . clopidogrel (PLAVIX) 75 MG tablet Take 75 mg by mouth daily.   3  . Cranberry 1000 MG CAPS Take 1,000 mg by mouth daily.    . cyanocobalamin (,VITAMIN B-12,) 1000 MCG/ML injection Inject 1,000 mcg into the muscle every 30 (thirty) days.    . folic acid (FOLVITE) 1 MG tablet Take 1 mg by mouth daily.   3  . gabapentin (NEURONTIN) 100 MG capsule Take 100 mg by mouth 3 (three) times daily.    Marland Kitchen. ibuprofen (MOTRIN IB) 200 MG tablet Take 1 tablet (200 mg total) by mouth every 8 (eight) hours as needed for moderate pain or cramping. 30 tablet 0  . isosorbide mononitrate (ISMO,MONOKET) 20 MG tablet Take 20 mg by mouth daily.   3   . losartan (COZAAR) 100 MG tablet Take 100 mg by mouth daily.   3  . lovastatin (MEVACOR) 20 MG tablet Take 20 mg by mouth at bedtime.   3  . metFORMIN (GLUCOPHAGE) 500 MG tablet Take 500 mg by mouth 3 (three) times daily.   3  . nitrofurantoin, macrocrystal-monohydrate, (MACROBID) 100 MG capsule Take 100 mg by mouth every Monday, Wednesday, and Friday.     Marland Kitchen. omeprazole (PRILOSEC) 20 MG capsule Take 20 mg by mouth daily.    . ondansetron (ZOFRAN ODT) 4 MG disintegrating tablet Take 1 tablet (4 mg total) by mouth every 8 (eight) hours as needed for nausea or vomiting. 20 tablet 0  . pioglitazone (ACTOS) 15 MG tablet Take 15 mg by mouth daily.   2     . aspirin EC  81 mg Oral Daily  . citalopram  10 mg Oral QHS  . clopidogrel  75 mg Oral Daily  . docusate sodium  100 mg Oral BID  . enoxaparin (LOVENOX) injection  40 mg Subcutaneous Q24H  . gabapentin  100 mg Oral TID  . insulin aspart  0-9 Units Subcutaneous Q6H  . isosorbide mononitrate  20 mg Oral Daily  . losartan  100 mg Oral Daily  . pantoprazole  40 mg Oral  Daily  . pravastatin  20 mg Oral q1800  . sodium chloride flush  3 mL Intravenous Q12H    Infusions:    Allergies  Allergen Reactions  . Sulfa Antibiotics     unknown    Social History   Social History  . Marital Status: Widowed    Spouse Name: N/A  . Number of Children: N/A  . Years of Education: N/A   Occupational History  . Not on file.   Social History Main Topics  . Smoking status: Never Smoker   . Smokeless tobacco: Not on file  . Alcohol Use: No  . Drug Use: No  . Sexual Activity: Not on file   Other Topics Concern  . Not on file   Social History Narrative    Family History  Problem Relation Age of Onset  . Hypertension Father     PHYSICAL EXAM: Filed Vitals:   07/02/15 2200 07/03/15 0411  BP: 155/68 152/74  Pulse: 84 95  Temp: 97.7 F (36.5 C) 97.6 F (36.4 C)  Resp: 16 18     Intake/Output Summary (Last 24 hours) at 07/03/15  0825 Last data filed at 07/03/15 0548  Gross per 24 hour  Intake      3 ml  Output    650 ml  Net   -647 ml    General:  Well appearing. No respiratory difficulty HEENT: normal Neck: supple. no JVD. Carotids 2+ bilat; no bruits. No lymphadenopathy or thryomegaly appreciated. Cor: PMI nondisplaced. Regular rate & rhythm. No rubs, gallops or murmurs. Lungs: clear Abdomen: soft, nontender, nondistended. No hepatosplenomegaly. No bruits or masses. Good bowel sounds. Extremities: no cyanosis, clubbing, rash, edema Neuro: alert & oriented x 3, cranial nerves grossly intact. moves all 4 extremities w/o difficulty. Affect pleasant.  ECG: Normal sinus rhythm 70 bpm no acute changes  Results for orders placed or performed during the hospital encounter of 07/02/15 (from the past 24 hour(s))  Comprehensive metabolic panel     Status: Abnormal   Collection Time: 07/02/15  6:42 PM  Result Value Ref Range   Sodium 128 (L) 135 - 145 mmol/L   Potassium 4.1 3.5 - 5.1 mmol/L   Chloride 94 (L) 101 - 111 mmol/L   CO2 28 22 - 32 mmol/L   Glucose, Bld 132 (H) 65 - 99 mg/dL   BUN 11 6 - 20 mg/dL   Creatinine, Ser 1.61 0.44 - 1.00 mg/dL   Calcium 8.5 (L) 8.9 - 10.3 mg/dL   Total Protein 6.9 6.5 - 8.1 g/dL   Albumin 3.2 (L) 3.5 - 5.0 g/dL   AST 21 15 - 41 U/L   ALT 10 (L) 14 - 54 U/L   Alkaline Phosphatase 60 38 - 126 U/L   Total Bilirubin 1.0 0.3 - 1.2 mg/dL   GFR calc non Af Amer 59 (L) >60 mL/min   GFR calc Af Amer >60 >60 mL/min   Anion gap 6 5 - 15  Brain natriuretic peptide     Status: Abnormal   Collection Time: 07/02/15  6:42 PM  Result Value Ref Range   B Natriuretic Peptide 428.0 (H) 0.0 - 100.0 pg/mL  Troponin I     Status: Abnormal   Collection Time: 07/02/15  6:42 PM  Result Value Ref Range   Troponin I 0.06 (H) <0.031 ng/mL  Lactic acid, plasma     Status: None   Collection Time: 07/02/15  6:42 PM  Result Value Ref Range   Lactic Acid, Venous  1.4 0.5 - 2.0 mmol/L  CBC with  Differential     Status: Abnormal   Collection Time: 07/02/15  6:42 PM  Result Value Ref Range   WBC 5.4 3.6 - 11.0 K/uL   RBC 3.73 (L) 3.80 - 5.20 MIL/uL   Hemoglobin 10.9 (L) 12.0 - 16.0 g/dL   HCT 96.2 (L) 95.2 - 84.1 %   MCV 87.6 80.0 - 100.0 fL   MCH 29.3 26.0 - 34.0 pg   MCHC 33.5 32.0 - 36.0 g/dL   RDW 32.4 (H) 40.1 - 02.7 %   Platelets 259 150 - 440 K/uL   Neutrophils Relative % 74 %   Neutro Abs 4.0 1.4 - 6.5 K/uL   Lymphocytes Relative 13 %   Lymphs Abs 0.7 (L) 1.0 - 3.6 K/uL   Monocytes Relative 12 %   Monocytes Absolute 0.7 0.2 - 0.9 K/uL   Eosinophils Relative 1 %   Eosinophils Absolute 0.1 0 - 0.7 K/uL   Basophils Relative 0 %   Basophils Absolute 0.0 0 - 0.1 K/uL  Glucose, capillary     Status: None   Collection Time: 07/02/15 10:31 PM  Result Value Ref Range   Glucose-Capillary 99 65 - 99 mg/dL  Basic metabolic panel     Status: Abnormal   Collection Time: 07/03/15  1:03 AM  Result Value Ref Range   Sodium 128 (L) 135 - 145 mmol/L   Potassium 3.8 3.5 - 5.1 mmol/L   Chloride 96 (L) 101 - 111 mmol/L   CO2 27 22 - 32 mmol/L   Glucose, Bld 168 (H) 65 - 99 mg/dL   BUN 10 6 - 20 mg/dL   Creatinine, Ser 2.53 0.44 - 1.00 mg/dL   Calcium 8.3 (L) 8.9 - 10.3 mg/dL   GFR calc non Af Amer >60 >60 mL/min   GFR calc Af Amer >60 >60 mL/min   Anion gap 5 5 - 15  CBC     Status: Abnormal   Collection Time: 07/03/15  1:03 AM  Result Value Ref Range   WBC 4.5 3.6 - 11.0 K/uL   RBC 3.52 (L) 3.80 - 5.20 MIL/uL   Hemoglobin 10.2 (L) 12.0 - 16.0 g/dL   HCT 66.4 (L) 40.3 - 47.4 %   MCV 86.4 80.0 - 100.0 fL   MCH 29.0 26.0 - 34.0 pg   MCHC 33.5 32.0 - 36.0 g/dL   RDW 25.9 (H) 56.3 - 87.5 %   Platelets 253 150 - 440 K/uL  Troponin I     Status: None   Collection Time: 07/03/15  1:03 AM  Result Value Ref Range   Troponin I 0.03 <0.031 ng/mL  Glucose, capillary     Status: Abnormal   Collection Time: 07/03/15  5:35 AM  Result Value Ref Range   Glucose-Capillary 107 (H)  65 - 99 mg/dL   Ct Angio Chest Pe W/cm &/or Wo Cm  07/02/2015  CLINICAL DATA:  Pleuritic chest pain and tachycardia. Concern pulmonary embolism. EXAM: CT ANGIOGRAPHY CHEST WITH CONTRAST TECHNIQUE: Multidetector CT imaging of the chest was performed using the standard protocol during bolus administration of intravenous contrast. Multiplanar CT image reconstructions and MIPs were obtained to evaluate the vascular anatomy. CONTRAST:  Seventy-five cc Isovue COMPARISON:  07/02/2015, chest radiograph, lumbar radiograph 06/28/2015, CT abdomen 09/05/2013 FINDINGS: Mediastinum/Nodes: No filling defects within pulmonary suggest acute pulmonary embolism. No acute findings aorta great vessels. Coronary calcifications are present. No pericardial fluid. Normal esophagus. Lungs/Pleura: No pulmonary infarction. Calcified granuloma in  the LEFT upper lobe measures 8 mm on image 34, series 6. More superior noncalcified nodule mean 6 mm (image 27, series 6). There is bibasilar atelectasis. Limited view of the liver, kidneys, pancreas are unremarkable. Normal adrenal glands. Upper abdomen: No aggressive osseous lesion. Musculoskeletal: No aggressive osseous lesion. Severe compression fracture at T12 with approximately 50% loss of vertebral body height. There is mild retropulsion which indents the ventral thecal sac. Finding present on radiograph 06/28/2015 and new from CT of 2015. There is mild stranding and gas within the soft tissues about the fracture which suggests fracture was acute on the radiograph of 06/28/2015. Review of the MIP images confirms the above findings. IMPRESSION: 1. No acute pulmonary embolism. 2. Subacute severe compression fracture at T12 with mild retropulsion. 3. A small LEFT upper lobe pulmonary nodule. Non-contrast chest CT at 6-12 months is recommended. If the nodule is stable at time of repeat CT, then future CT at 18-24 months (from today's scan) is considered optional for low-risk patients, but is  recommended for high-risk patients. This recommendation follows the consensus statement: Guidelines for Management of Incidental Pulmonary Nodules Detected on CT Images:From the Fleischner Society 2017; published online before print (10.1148/radiol.1610960454). Electronically Signed   By: Genevive Bi M.D.   On: 07/02/2015 20:24   Dg Chest Portable 1 View  07/02/2015  CLINICAL DATA:  Tachycardia EXAM: PORTABLE CHEST 1 VIEW COMPARISON:  06/15/2015 FINDINGS: Cardiomegaly again noted. No infiltrate or pulmonary edema. Calcified granuloma in left upper lobe again noted. Persistent somewhat spiculated nodule in left upper lobe measures about 5.3 cm without significant change from prior exam. Further correlation with CT scan of the chest is again recommended. IMPRESSION: No infiltrate or pulmonary edema. Calcified granuloma in left upper lobe again noted. Persistent somewhat spiculated nodule in left upper lobe measures about 5.3 cm without significant change from prior exam. Further correlation with CT scan of the chest is again recommended. Electronically Signed   By: Natasha Mead M.D.   On: 07/02/2015 18:41     ASSESSMENT AND PLAN: Atypical chest pain with sinus tachycardia when patient came in the emergency room. Patient denies any chest pain right now. EKG is unremarkable. One of the troponin R23 troponin was mildly elevated 0.06. I discussed with the family they do not want to have invasive procedures or stress test. Echocardiogram is being ordered to evaluate ejection fraction. Patient can be given metoprolol 25 mg by mouth twice a day and aspirin and Lasix. We'll treat the patient medically had a long discussion with the daughter and the son and they will follow-up with me in the office. Maybe we will watch the patient today and discharge the patient tomorrow.  Chelsye Suhre A

## 2015-07-03 NOTE — Progress Notes (Signed)
Initial Nutrition Assessment  DOCUMENTATION CODES:   Severe malnutrition in context of chronic illness  INTERVENTION:  Meals and snacks: Cater to pt preferences. Recommend liberalizing diet to regular, vegetarian Medical Nutrition Supplement Therapy: recommend adding Ensure Enlive po BID, each supplement provides 350 kcal and 20 grams of protein for added nutrition   NUTRITION DIAGNOSIS:   Inadequate oral intake related to chronic illness as evidenced by per patient/family report.    GOAL:   Patient will meet greater than or equal to 90% of their needs    MONITOR:   PO intake, Supplement acceptance  REASON FOR ASSESSMENT:   Malnutrition Screening Tool    ASSESSMENT:      Pt admitted with chest pain, HTN urgency  Past Medical History  Diagnosis Date  . Diabetes mellitus without complication (HCC)   . Myocardial infarct (HCC)   . Hypertension   . Compression fracture of T12 vertebra (HCC)   . Pulmonary hypertension (HCC)   . CAD (coronary artery disease)   . Knee osteoarthritis   . GERD (gastroesophageal reflux disease)     Current Nutrition: eating 0-bites  Food/Nutrition-Related History: Son at bedside and reports for the past 6 months decreased intake, bites at meal times   Scheduled Medications:  . aspirin EC  81 mg Oral Daily  . citalopram  10 mg Oral QHS  . clopidogrel  75 mg Oral Daily  . docusate sodium  100 mg Oral BID  . enoxaparin (LOVENOX) injection  40 mg Subcutaneous Q24H  . gabapentin  100 mg Oral TID  . insulin aspart  0-9 Units Subcutaneous Q6H  . isosorbide mononitrate  20 mg Oral Daily  . losartan  100 mg Oral Daily  . metoprolol tartrate  25 mg Oral BID  . pantoprazole  40 mg Oral Daily  . pravastatin  20 mg Oral q1800  . sodium chloride flush  3 mL Intravenous Q12H     Electrolyte/Renal Profile and Glucose Profile:   Recent Labs Lab 06/29/15 0623 07/02/15 1842 07/03/15 0103  NA 126* 128* 128*  K 4.9 4.1 3.8  CL 94*  94* 96*  CO2 26 28 27   BUN 14 11 10   CREATININE 1.03* 0.84 0.76  CALCIUM 8.4* 8.5* 8.3*  GLUCOSE 121* 132* 168*   Protein Profile:  Recent Labs Lab 07/02/15 1842  ALBUMIN 3.2*    Gastrointestinal Profile: Last BM:3/24   Nutrition-Focused Physical Exam Findings: deferred at this time   Weight Change: 6% wt loss in the last month  Wt Readings from Last 10 Encounters:  07/02/15  119 lb 4.8 oz (54.114 kg)  06/28/15  121 lb 6.4 oz (55.067 kg)  06/15/15  127 lb (57.607 kg)   Diet Order:  Diet heart healthy/carb modified Room service appropriate?: Yes; Fluid consistency:: Thin  Skin:   reviewed   Height:   Ht Readings from Last 1 Encounters:  07/02/15 5' (1.524 m)    Weight:   Wt Readings from Last 1 Encounters:  07/02/15 119 lb 4.8 oz (54.114 kg)    Ideal Body Weight:     BMI:  Body mass index is 23.3 kg/(m^2).  Estimated Nutritional Needs:   Kcal:  BEE 871 kcals (IF 1.1-1.3, AF 1.2) 1150-1358 kcals/d.   Protein:  (1.1-1.3 g/kg) 59-70 g/d  Fluid:  (25-5730ml/kg) 1350-162020ml/d  EDUCATION NEEDS:   No education needs identified at this time  HIGH Care Level  Angela Vargas, RD, LDN [phone] (pager) Weekend/On-Call pager [phone])

## 2015-07-03 NOTE — Progress Notes (Signed)
[facility] [place] [facility]   PATIENT NAME: Angela HousemanJeliben Vargas    MR#:  [id number]  DATE OF BIRTH:  08-May-1921  SUBJECTIVE:  Patient's daughter at bedside No further complaints no episodes of the chest pain  REVIEW OF SYSTEMS:  CONSTITUTIONAL: No fever, fatigue or weakness.  EYES: No blurred or double vision.  EARS, NOSE, AND THROAT: No tinnitus or ear pain.  RESPIRATORY: No cough, shortness of breath, wheezing or hemoptysis.  CARDIOVASCULAR: No chest pain, orthopnea, edema.  GASTROINTESTINAL: No nausea, vomiting, diarrhea or abdominal pain.  GENITOURINARY: No dysuria, hematuria.  ENDOCRINE: No polyuria, nocturia,  HEMATOLOGY: No anemia, easy bruising or bleeding SKIN: No rash or lesion. MUSCULOSKELETAL: No joint pain or arthritis.   NEUROLOGIC: No tingling, numbness, weakness.  PSYCHIATRY: No anxiety or depression.   DRUG ALLERGIES:   Allergies  Allergen Reactions  . Sulfa Antibiotics     unknown    VITALS:  Blood pressure 131/75, pulse 79, temperature 97.6 F (36.4 C), temperature source Oral, resp. rate 20, height 5' (1.524 m), weight 54.114 kg (119 lb 4.8 oz), SpO2 95 %.  PHYSICAL EXAMINATION:  VITAL SIGNS: Filed Vitals:   07/03/15 0800 07/03/15 1153  BP: 115/62 131/75  Pulse: 100 79  Temp: 98.1 F (36.7 C) 97.6 F (36.4 C)  Resp: 18 20   GENERAL:82 y.o.female currently in no acute distress.  HEAD: Normocephalic, atraumatic.  EYES: Pupils equal, round, reactive to light. Extraocular muscles intact. No scleral icterus.  MOUTH: Moist mucosal membrane. Dentition intact. No abscess noted.  EAR, NOSE, THROAT: Clear without exudates. No external lesions.  NECK: Supple. No thyromegaly. No nodules. No JVD.  PULMONARY: Clear to ascultation, without wheeze rails or rhonci. No use of accessory muscles, Good respiratory effort. good air entry bilaterally CHEST: Nontender to palpation.  CARDIOVASCULAR: S1 and S2. Regular rate and rhythm. No  murmurs, rubs, or gallops. No edema. Pedal pulses 2+ bilaterally.  GASTROINTESTINAL: Soft, nontender, nondistended. No masses. Positive bowel sounds. No hepatosplenomegaly.  MUSCULOSKELETAL: No swelling, clubbing, or edema. Range of motion full in all extremities.  NEUROLOGIC: Cranial nerves II through XII are intact. No gross focal neurological deficits. Sensation intact. Reflexes intact.  SKIN: No ulceration, lesions, rashes, or cyanosis. Skin warm and dry. Turgor intact.  PSYCHIATRIC: Mood, affect within normal limits. The patient is awake, alert and oriented x 3. Insight, judgment intact.      LABORATORY PANEL:   CBC  Recent Labs Lab 07/03/15 0103  WBC 4.5  HGB 10.2*  HCT 30.5*  PLT 253   ------------------------------------------------------------------------------------------------------------------  Chemistries   Recent Labs Lab 07/02/15 1842 07/03/15 0103  NA 128* 128*  K 4.1 3.8  CL 94* 96*  CO2 28 27  GLUCOSE 132* 168*  BUN 11 10  CREATININE 0.84 0.76  CALCIUM 8.5* 8.3*  AST 21  --   ALT 10*  --   ALKPHOS 60  --   BILITOT 1.0  --    ------------------------------------------------------------------------------------------------------------------  Cardiac Enzymes  Recent Labs Lab 07/03/15 1018  TROPONINI 0.03   ------------------------------------------------------------------------------------------------------------------  RADIOLOGY:  Ct Angio Chest Pe W/cm &/or Wo Cm  07/02/2015  CLINICAL DATA:  Pleuritic chest pain and tachycardia. Concern pulmonary embolism. EXAM: CT ANGIOGRAPHY CHEST WITH CONTRAST TECHNIQUE: Multidetector CT imaging of the chest was performed using the standard protocol during bolus administration of intravenous contrast. Multiplanar CT image reconstructions and MIPs were obtained to evaluate the vascular anatomy. CONTRAST:  Seventy-five cc Isovue COMPARISON:  07/02/2015, chest radiograph, lumbar radiograph  06/28/2015, CT abdomen  09/05/2013 FINDINGS: Mediastinum/Nodes: No filling defects within pulmonary suggest acute pulmonary embolism. No acute findings aorta great vessels. Coronary calcifications are present. No pericardial fluid. Normal esophagus. Lungs/Pleura: No pulmonary infarction. Calcified granuloma in the LEFT upper lobe measures 8 mm on image 34, series 6. More superior noncalcified nodule mean 6 mm (image 27, series 6). There is bibasilar atelectasis. Limited view of the liver, kidneys, pancreas are unremarkable. Normal adrenal glands. Upper abdomen: No aggressive osseous lesion. Musculoskeletal: No aggressive osseous lesion. Severe compression fracture at T12 with approximately 50% loss of vertebral body height. There is mild retropulsion which indents the ventral thecal sac. Finding present on radiograph 06/28/2015 and new from CT of 2015. There is mild stranding and gas within the soft tissues about the fracture which suggests fracture was acute on the radiograph of 06/28/2015. Review of the MIP images confirms the above findings. IMPRESSION: 1. No acute pulmonary embolism. 2. Subacute severe compression fracture at T12 with mild retropulsion. 3. A small LEFT upper lobe pulmonary nodule. Non-contrast chest CT at 6-12 months is recommended. If the nodule is stable at time of repeat CT, then future CT at 18-24 months (from today's scan) is considered optional for low-risk patients, but is recommended for high-risk patients. This recommendation follows the consensus statement: Guidelines for Management of Incidental Pulmonary Nodules Detected on CT Images:From the Fleischner Society 2017; published online before print (10.1148/radiol.1610960454). Electronically Signed   By: Genevive Bi M.D.   On: 07/02/2015 20:24   Dg Chest Portable 1 View  07/02/2015  CLINICAL DATA:  Tachycardia EXAM: PORTABLE CHEST 1 VIEW COMPARISON:  06/15/2015 FINDINGS: Cardiomegaly again noted. No infiltrate or pulmonary edema. Calcified granuloma  in left upper lobe again noted. Persistent somewhat spiculated nodule in left upper lobe measures about 5.3 cm without significant change from prior exam. Further correlation with CT scan of the chest is again recommended. IMPRESSION: No infiltrate or pulmonary edema. Calcified granuloma in left upper lobe again noted. Persistent somewhat spiculated nodule in left upper lobe measures about 5.3 cm without significant change from prior exam. Further correlation with CT scan of the chest is again recommended. Electronically Signed   By: Natasha Mead M.D.   On: 07/02/2015 18:41    EKG:   Orders placed or performed during the hospital encounter of 07/02/15  . EKG 12-Lead  . EKG 12-Lead    ASSESSMENT AND PLAN:   82 year old female admitted [birth year]/01/68 with chest pain and tachycardia  1. Chest pain, nonischemic: Appreciate cardiology input continue aspirin and Plavix cardiac enzymes within normal trend echocardiogram pending per cardiology 2. Hypertensive urgency: Blood pressure improved with additional metoprolol continue to monitor 3. GERD without esophagitis PPI therapy 4. Hyperlipidemia unspecified statin therapy 5. Venous thromboembolism prophylactic: Lovenox     All the records are reviewed and case discussed with Care Management/Social Workerr. Management plans discussed with the patient, family and they are in agreement.  CODE STATUS: Full  TOTAL TIME TAKING CARE OF THIS PATIENT: 28 minutes.   POSSIBLE D/C IN one DAYS, DEPENDING ON CLINICAL CONDITION.   Damari Suastegui,  Mardi Mainland.D on 07/03/2015 at 12:15 PM  Between 7am to 6pm - Pager - [phone]  After 6pm: House Pager: - [phone]  [facility]  Office  [phone]  CC: Primary care physician; Lyndon Code, MD

## 2015-07-04 DIAGNOSIS — R079 Chest pain, unspecified: Secondary | ICD-10-CM | POA: Diagnosis not present

## 2015-07-04 DIAGNOSIS — I160 Hypertensive urgency: Secondary | ICD-10-CM | POA: Diagnosis not present

## 2015-07-04 DIAGNOSIS — I10 Essential (primary) hypertension: Secondary | ICD-10-CM | POA: Diagnosis not present

## 2015-07-04 DIAGNOSIS — E43 Unspecified severe protein-calorie malnutrition: Secondary | ICD-10-CM | POA: Diagnosis present

## 2015-07-04 DIAGNOSIS — E119 Type 2 diabetes mellitus without complications: Secondary | ICD-10-CM | POA: Diagnosis not present

## 2015-07-04 DIAGNOSIS — K219 Gastro-esophageal reflux disease without esophagitis: Secondary | ICD-10-CM | POA: Diagnosis not present

## 2015-07-04 DIAGNOSIS — R7989 Other specified abnormal findings of blood chemistry: Secondary | ICD-10-CM | POA: Diagnosis not present

## 2015-07-04 LAB — GLUCOSE, CAPILLARY
GLUCOSE-CAPILLARY: 168 mg/dL — AB (ref 65–99)
GLUCOSE-CAPILLARY: 138 mg/dL — AB (ref 65–99)
Glucose-Capillary: 111 mg/dL — ABNORMAL HIGH (ref 65–99)
Glucose-Capillary: 120 mg/dL — ABNORMAL HIGH (ref 65–99)

## 2015-07-04 MED ORDER — METOPROLOL TARTRATE 25 MG PO TABS: 25.0000 mg | ORAL_TABLET | Freq: Two times a day (BID) | ORAL | Status: AC

## 2015-07-04 MED ORDER — ENSURE ENLIVE PO LIQD: 237.0000 mL | Freq: Two times a day (BID) | ORAL | Status: AC

## 2015-07-04 NOTE — Progress Notes (Signed)
  Angela Vargas to be D/C'd home with family per MD order.   Filed Vitals:   07/04/15 0800 07/04/15 1219  BP: 160/74 140/72  Pulse: 91 78  Temp: 98.5 F (36.9 C) 96.8 F (36 C)  Resp: 16 20   Cardiac monitoring discontinued. IV catheter discontinued intact. Site without signs and symptoms of complications. Dressing and pressure applied. Pt denies pain at this time. Skin with head laceration noted from when she was admitted, with 5 staples, otherwise skin intact. No complaints noted.  An After Visit Summary was printed and given to the patient. [facility] and D/C'd home via private vehicle with family.  Syliva OvermanMiller, Madilyne Tadlock N 07/04/2015 10:29 PM

## 2015-07-04 NOTE — Discharge Summary (Signed)
[facility] [place] [facility]   PATIENT NAME: Angela Vargas    MR#:  [id number]  DATE OF BIRTH:  12-31-1921  DATE OF ADMISSION:  07/02/2015 ADMITTING PHYSICIAN: Oralia Manisavid Willis, MD  DATE OF DISCHARGE: No discharge date for patient encounter.  PRIMARY CARE PHYSICIAN: Lyndon CodeKHAN, FOZIA M, MD    ADMISSION DIAGNOSIS:  Chest pain  DISCHARGE DIAGNOSIS:  Chest pain, nonischemic Severe protein malnutrition   SECONDARY DIAGNOSIS:   Past Medical History  Diagnosis Date  . Diabetes mellitus without complication (HCC)   . Myocardial infarct (HCC)   . Hypertension   . Compression fracture of T12 vertebra (HCC)   . Pulmonary hypertension (HCC)   . CAD (coronary artery disease)   . Knee osteoarthritis   . GERD (gastroesophageal reflux disease)     HOSPITAL COURSE:  Angela HousemanJeliben Vargas  is a 82 y.o. female admitted 07/02/2015 with chief complaint chest pain. Please see H&P performed by Dr. Anne HahnWillis for further information. Patient was admitted to telemetry with the above symptoms with reassuring cardiac enzymes. Cardiology consult with echocardiogram performed without major issues. Patient was also evaluated by dietary given protein malnutrition and recommended starting on ensure. She was noted to be markedly hypertensive on admission which has improved with metoprolol at cardiology's recommendation. She is improved and stable for discharge to follow up with cardiology and PCP as outpatient  DISCHARGE CONDITIONS:   Stable/improved  CONSULTS OBTAINED:  Treatment Team:  Wyatt Hasteavid K Hower, MD Laurier NancyShaukat A Khan, MD  DRUG ALLERGIES:   Allergies  Allergen Reactions  . Sulfa Antibiotics     unknown    DISCHARGE MEDICATIONS:   Current Discharge Medication List    START taking these medications   Details  feeding supplement, ENSURE ENLIVE, (ENSURE ENLIVE) LIQD Take 237 mLs by mouth 2 (two) times daily between meals. Qty: 237 mL, Refills: 12    metoprolol tartrate  (LOPRESSOR) 25 MG tablet Take 1 tablet (25 mg total) by mouth 2 (two) times daily. Qty: 60 tablet, Refills: 0      CONTINUE these medications which have NOT CHANGED   Details  acetaminophen (TYLENOL) 500 MG tablet Take 1-2 tablets (500-1,000 mg total) by mouth every 6 (six) hours as needed for moderate pain. Qty: 30 tablet, Refills: 0    aspirin EC 81 MG tablet Take 81 mg by mouth daily.    Calcium Carbonate-Vitamin D (CALCIUM 600+D) 600-200 MG-UNIT TABS Take 1 tablet by mouth daily.    citalopram (CELEXA) 10 MG tablet Take 10 mg by mouth at bedtime.  Refills: 3    clopidogrel (PLAVIX) 75 MG tablet Take 75 mg by mouth daily.  Refills: 3    Cranberry 1000 MG CAPS Take 1,000 mg by mouth daily.    cyanocobalamin (,VITAMIN B-12,) 1000 MCG/ML injection Inject 1,000 mcg into the muscle every 30 (thirty) days.    folic acid (FOLVITE) 1 MG tablet Take 1 mg by mouth daily.  Refills: 3    gabapentin (NEURONTIN) 100 MG capsule Take 100 mg by mouth 3 (three) times daily.    ibuprofen (MOTRIN IB) 200 MG tablet Take 1 tablet (200 mg total) by mouth every 8 (eight) hours as needed for moderate pain or cramping. Qty: 30 tablet, Refills: 0    isosorbide mononitrate (ISMO,MONOKET) 20 MG tablet Take 20 mg by mouth daily.  Refills: 3    losartan (COZAAR) 100 MG tablet Take 100 mg by mouth daily.  Refills: 3    lovastatin (MEVACOR) 20 MG tablet  Take 20 mg by mouth at bedtime.  Refills: 3    metFORMIN (GLUCOPHAGE) 500 MG tablet Take 500 mg by mouth 3 (three) times daily.  Refills: 3    nitrofurantoin, macrocrystal-monohydrate, (MACROBID) 100 MG capsule Take 100 mg by mouth every Monday, Wednesday, and Friday.     omeprazole (PRILOSEC) 20 MG capsule Take 20 mg by mouth daily.    ondansetron (ZOFRAN ODT) 4 MG disintegrating tablet Take 1 tablet (4 mg total) by mouth every 8 (eight) hours as needed for nausea or vomiting. Qty: 20 tablet, Refills: 0    pioglitazone (ACTOS) 15 MG tablet Take  15 mg by mouth daily.  Refills: 2         DISCHARGE INSTRUCTIONS:    DIET:  Cardiac diet  DISCHARGE CONDITION:  Stable  ACTIVITY:  Activity as tolerated  OXYGEN:  Home Oxygen: No.   Oxygen Delivery: room air  DISCHARGE LOCATION:  home   If you experience worsening of your admission symptoms, develop shortness of breath, life threatening emergency, suicidal or homicidal thoughts you must seek medical attention immediately by calling 911 or calling your MD immediately  if symptoms less severe.  You Must read complete instructions/literature along with all the possible adverse reactions/side effects for all the Medicines you take and that have been prescribed to you. Take any new Medicines after you have completely understood and accpet all the possible adverse reactions/side effects.   Please note  You were cared for by a hospitalist during your hospital stay. If you have any questions about your discharge medications or the care you received while you were in the hospital after you are discharged, you can call the unit and asked to speak with the hospitalist on call if the hospitalist that took care of you is not available. Once you are discharged, your primary care physician will handle any further medical issues. Please note that NO REFILLS for any discharge medications will be authorized once you are discharged, as it is imperative that you return to your primary care physician (or establish a relationship with a primary care physician if you do not have one) for your aftercare needs so that they can reassess your need for medications and monitor your lab values.    On the day of Discharge:   VITAL SIGNS:  Blood pressure 160/74, pulse 91, temperature 98.5 F (36.9 C), temperature source Oral, resp. rate 16, height 5' (1.524 m), weight 54.114 kg (119 lb 4.8 oz), SpO2 95 %.  I/O:   Intake/Output Summary (Last 24 hours) at 07/04/15 1024 Last data filed at 07/04/15 1610   Gross per 24 hour  Intake    243 ml  Output    700 ml  Net   -457 ml    PHYSICAL EXAMINATION:  GENERAL:  82 y.o.-year-old patient lying in the bed with no acute distress.  EYES: Pupils equal, round, reactive to light and accommodation. No scleral icterus. Extraocular muscles intact.  HEENT: Head atraumatic, normocephalic. Oropharynx and nasopharynx clear.  NECK:  Supple, no jugular venous distention. No thyroid enlargement, no tenderness.  LUNGS: Normal breath sounds bilaterally, no wheezing, rales,rhonchi or crepitation. No use of accessory muscles of respiration.  CARDIOVASCULAR: S1, S2 normal. No murmurs, rubs, or gallops.  ABDOMEN: Soft, non-tender, non-distended. Bowel sounds present. No organomegaly or mass.  EXTREMITIES: No pedal edema, cyanosis, or clubbing.  NEUROLOGIC: Cranial nerves II through XII are intact. Muscle strength 5/5 in all extremities. Sensation intact. Gait not checked.  PSYCHIATRIC: The patient  is alert and oriented x 3.  SKIN: No obvious rash, lesion, or ulcer.   DATA REVIEW:   CBC  Recent Labs Lab 07/03/15 0103  WBC 4.5  HGB 10.2*  HCT 30.5*  PLT 253    Chemistries   Recent Labs Lab 07/02/15 1842 07/03/15 0103  NA 128* 128*  K 4.1 3.8  CL 94* 96*  CO2 28 27  GLUCOSE 132* 168*  BUN 11 10  CREATININE 0.84 0.76  CALCIUM 8.5* 8.3*  AST 21  --   ALT 10*  --   ALKPHOS 60  --   BILITOT 1.0  --     Cardiac Enzymes  Recent Labs Lab 07/03/15 1018  TROPONINI 0.03    Microbiology Results  Results for orders placed or performed in visit on 09/05/13  Clostridium Difficile [facility])     Status: None   Collection Time: 09/07/13  1:57 PM  Result Value Ref Range Status   Micro Text Report   Final       C.DIFFICILE ANTIGEN       C.DIFFICILE GDH ANTIGEN : NEGATIVE   C.DIFFICILE TOXIN A/B     C.DIFFICILE TOXINS A AND B : NEGATIVE   INTERPRETATION            Negative for C. difficile.    ANTIBIOTIC                                                         RADIOLOGY:  Ct Angio Chest Pe W/cm &/or Wo Cm  07/02/2015  CLINICAL DATA:  Pleuritic chest pain and tachycardia. Concern pulmonary embolism. EXAM: CT ANGIOGRAPHY CHEST WITH CONTRAST TECHNIQUE: Multidetector CT imaging of the chest was performed using the standard protocol during bolus administration of intravenous contrast. Multiplanar CT image reconstructions and MIPs were obtained to evaluate the vascular anatomy. CONTRAST:  Seventy-five cc Isovue COMPARISON:  07/02/2015, chest radiograph, lumbar radiograph 06/28/2015, CT abdomen 09/05/2013 FINDINGS: Mediastinum/Nodes: No filling defects within pulmonary suggest acute pulmonary embolism. No acute findings aorta great vessels. Coronary calcifications are present. No pericardial fluid. Normal esophagus. Lungs/Pleura: No pulmonary infarction. Calcified granuloma in the LEFT upper lobe measures 8 mm on image 34, series 6. More superior noncalcified nodule mean 6 mm (image 27, series 6). There is bibasilar atelectasis. Limited view of the liver, kidneys, pancreas are unremarkable. Normal adrenal glands. Upper abdomen: No aggressive osseous lesion. Musculoskeletal: No aggressive osseous lesion. Severe compression fracture at T12 with approximately 50% loss of vertebral body height. There is mild retropulsion which indents the ventral thecal sac. Finding present on radiograph 06/28/2015 and new from CT of 2015. There is mild stranding and gas within the soft tissues about the fracture which suggests fracture was acute on the radiograph of 06/28/2015. Review of the MIP images confirms the above findings. IMPRESSION: 1. No acute pulmonary embolism. 2. Subacute severe compression fracture at T12 with mild retropulsion. 3. A small LEFT upper lobe pulmonary nodule. Non-contrast chest CT at 6-12 months is recommended. If the nodule is stable at time of repeat CT, then future CT at 18-24 months (from today's scan) is considered optional for low-risk patients,  but is recommended for high-risk patients. This recommendation follows the consensus statement: Guidelines for Management of Incidental Pulmonary Nodules Detected on CT Images:From the Fleischner Society 2017; published online before print (10.1148/radiol.[id number]).  Electronically Signed   By: Genevive Bi M.D.   On: 07/02/2015 20:24   Dg Chest Portable 1 View  07/02/2015  CLINICAL DATA:  Tachycardia EXAM: PORTABLE CHEST 1 VIEW COMPARISON:  06/15/2015 FINDINGS: Cardiomegaly again noted. No infiltrate or pulmonary edema. Calcified granuloma in left upper lobe again noted. Persistent somewhat spiculated nodule in left upper lobe measures about 5.3 cm without significant change from prior exam. Further correlation with CT scan of the chest is again recommended. IMPRESSION: No infiltrate or pulmonary edema. Calcified granuloma in left upper lobe again noted. Persistent somewhat spiculated nodule in left upper lobe measures about 5.3 cm without significant change from prior exam. Further correlation with CT scan of the chest is again recommended. Electronically Signed   By: Natasha Mead M.D.   On: 07/02/2015 18:41     Management plans discussed with the patient, family and they are in agreement.  CODE STATUS:     Code Status Orders        Start     Ordered   07/02/15 2132  Full code   Continuous     07/02/15 2131    Code Status History    Date Active Date Inactive Code Status Order ID Comments User Context   06/28/2015  9:18 AM 06/29/2015  6:28 PM Full Code [id number]  Altamese Dilling, MD Inpatient      TOTAL TIME TAKING CARE OF THIS PATIENT: 28 minutes.    Hower,  Mardi Mainland.D on 07/04/2015 at 10:24 AM  Between 7am to 6pm - Pager - [phone]  After 6pm go to [email or web address] - password [email or web address] [facility]  [place] [facility]  Office  [phone]  CC: Primary care physician; Lyndon Code, MD

## 2015-07-04 NOTE — Progress Notes (Signed)
SUBJECTIVE: No chest pain   Filed Vitals:   07/03/15 1939 07/04/15 0630 07/04/15 0800 07/04/15 1219  BP: 136/79 167/73 160/74 140/72  Pulse: 88 86 91 78  Temp: 97.5 F (36.4 C) 97.6 F (36.4 C) 98.5 F (36.9 C) 96.8 F (36 C)  TempSrc: Oral  Oral Axillary  Resp: 16 16 16 20   Height:      Weight:      SpO2: 96% 94% 95% 96%    Intake/Output Summary (Last 24 hours) at 07/04/15 1310 Last data filed at 07/04/15 0945  Gross per 24 hour  Intake    243 ml  Output    800 ml  Net   -557 ml    LABS: Basic Metabolic Panel:  Recent Labs  04/54/0903/24/17 1842 07/03/15 0103  NA 128* 128*  K 4.1 3.8  CL 94* 96*  CO2 28 27  GLUCOSE 132* 168*  BUN 11 10  CREATININE 0.84 0.76  CALCIUM 8.5* 8.3*   Liver Function Tests:  Recent Labs  07/02/15 1842  AST 21  ALT 10*  ALKPHOS 60  BILITOT 1.0  PROT 6.9  ALBUMIN 3.2*   No results for input(s): LIPASE, AMYLASE in the last 72 hours. CBC:  Recent Labs  07/02/15 1842 07/03/15 0103  WBC 5.4 4.5  NEUTROABS 4.0  --   HGB 10.9* 10.2*  HCT 32.7* 30.5*  MCV 87.6 86.4  PLT 259 253   Cardiac Enzymes:  Recent Labs  07/03/15 0103 07/03/15 0646 07/03/15 1018  TROPONINI 0.03 0.04* 0.03   BNP: Invalid input(s): POCBNP D-Dimer: No results for input(s): DDIMER in the last 72 hours. Hemoglobin A1C:  Recent Labs  07/03/15 0103  HGBA1C 6.7*   Fasting Lipid Panel: No results for input(s): CHOL, HDL, LDLCALC, TRIG, CHOLHDL, LDLDIRECT in the last 72 hours. Thyroid Function Tests: No results for input(s): TSH, T4TOTAL, T3FREE, THYROIDAB in the last 72 hours.  Invalid input(s): FREET3 Anemia Panel: No results for input(s): VITAMINB12, FOLATE, FERRITIN, TIBC, IRON, RETICCTPCT in the last 72 hours.   PHYSICAL EXAM General: Well developed, well nourished, in no acute distress HEENT:  Normocephalic and atramatic Neck:  No JVD.  Lungs: Clear bilaterally to auscultation and percussion. Heart: HRRR . Normal S1 and S2 without  gallops or murmurs.  Abdomen: Bowel sounds are positive, abdomen soft and non-tender  Msk:  Back normal, normal gait. Normal strength and tone for age. Extremities: No clubbing, cyanosis or edema.   Neuro: Alert and oriented X 3. Psych:  Good affect, responds appropriately  TELEMETRY:Sinus rhythm  ASSESSMENT AND PLAN: Atypical chest pain with mildly elevated troponin. Patient will go home with follow-up in the office for outpatient Lexiscan Myoview. Patient was given appointment not this Tuesday but the next Tuesday at 10:30 AM.  Principal Problem:   Chest pain Active Problems:   Vertebral fracture, osteoporotic (HCC)   Acute CHF (congestive heart failure) (HCC)   GERD (gastroesophageal reflux disease)   Type 2 diabetes mellitus (HCC)   HTN (hypertension)   CAD (coronary artery disease)   Protein-calorie malnutrition, severe    Buford Gayler A, MD, [facility] 07/04/2015 1:10 PM   The top

## 2015-07-04 NOTE — Care Management Note (Signed)
Case Management Note  Patient Details  Name: Peter CongoJeliben B Sunderland MRN: [id number] Date of Birth: Jan 25, 1922  Subjective/Objective:     82yo Mrs Kenney HousemanJeliben Hochstatter is diagnosed with generalized weakness and a T12 vertebral compression fracture which results in pain and impedes her ability to walk even with a walker or cane. A wheelchair will allow her increased safe mobility and she has a caretaker in the home who can safely propel the wheelchair. A rquest for a wheelchair was called and faxed to [facility].  Family was advised by this writer that on weekends our DME sometimes takes many hours to be delivered.               Action/Plan:   Expected Discharge Date:                  Expected Discharge Plan:     In-House Referral:     Discharge planning Services     Post Acute Care Choice:    Choice offered to:     DME Arranged:    DME Agency:     HH Arranged:    HH Agency:     Status of Service:     Medicare Important Message Given:    Date Medicare IM Given:    Medicare IM give by:    Date Additional Medicare IM Given:    Additional Medicare Important Message give by:     If discussed at Long Length of Stay Meetings, dates discussed:    Additional Comments:  Akira Perusse A, RN 07/04/2015, 3:06 PM

## 2015-07-05 DIAGNOSIS — I10 Essential (primary) hypertension: Secondary | ICD-10-CM | POA: Diagnosis not present

## 2015-07-05 DIAGNOSIS — E119 Type 2 diabetes mellitus without complications: Secondary | ICD-10-CM | POA: Diagnosis not present

## 2015-07-05 DIAGNOSIS — I2510 Atherosclerotic heart disease of native coronary artery without angina pectoris: Secondary | ICD-10-CM | POA: Diagnosis not present

## 2015-07-05 DIAGNOSIS — W19XXXD Unspecified fall, subsequent encounter: Secondary | ICD-10-CM | POA: Diagnosis not present

## 2015-07-05 DIAGNOSIS — I272 Other secondary pulmonary hypertension: Secondary | ICD-10-CM | POA: Diagnosis not present

## 2015-07-05 DIAGNOSIS — M8008XD Age-related osteoporosis with current pathological fracture, vertebra(e), subsequent encounter for fracture with routine healing: Secondary | ICD-10-CM | POA: Diagnosis not present

## 2015-07-05 NOTE — Discharge Summary (Signed)
[facility] - [place] at [facility]   PATIENT NAME: Angela Vargas    MR#:  [id number]  DATE OF BIRTH:  06/02/1921  DATE OF ADMISSION:  06/28/2015 ADMITTING PHYSICIAN: Altamese Dilling, MD  DATE OF DISCHARGE: 06/29/2015  3:28 PM  PRIMARY CARE PHYSICIAN: Lyndon Code, MD    ADMISSION DIAGNOSIS:  Compression fracture [T14.8]  DISCHARGE DIAGNOSIS:  Principal Problem:   Vertebral fracture, osteoporotic (HCC)   SECONDARY DIAGNOSIS:   Past Medical History  Diagnosis Date  . Diabetes mellitus without complication (HCC)   . Myocardial infarct (HCC)   . Hypertension   . Compression fracture of T12 vertebra (HCC)   . Pulmonary hypertension (HCC)   . CAD (coronary artery disease)   . Knee osteoarthritis   . GERD (gastroesophageal reflux disease)     HOSPITAL COURSE:   IMPRESSION AND PLAN:  * Vertebral compression fracture  Will keep on the pain management, orthopedic consult for further management.  Ortho suggested consevative management,she tolerated walking.  * Hypertension  Currently stable, continue home medications.  * Diabetes Continue metformin and pioglitazone and will keep on insulin sliding scale coverage.  * Coronary artery disease  Continue aspirin but I'll hold Plavix for now until the decision is made about the procedure by orthopedics, continue other medications.  DISCHARGE CONDITIONS:   Stable.  CONSULTS OBTAINED:  Treatment Team:  Deeann Saint, MD  DRUG ALLERGIES:   Allergies  Allergen Reactions  . Sulfa Antibiotics     unknown    DISCHARGE MEDICATIONS:   Discharge Medication List as of 06/29/2015  1:49 PM    START taking these medications   Details  acetaminophen (TYLENOL) 500 MG tablet Take 1-2 tablets (500-1,000 mg total) by mouth every 6 (six) hours as needed for moderate pain., Starting 06/29/2015, Until Discontinued, Normal    ibuprofen (MOTRIN IB) 200 MG tablet Take 1 tablet (200 mg total) by  mouth every 8 (eight) hours as needed for moderate pain or cramping., Starting 06/29/2015, Until Discontinued, Normal      CONTINUE these medications which have NOT CHANGED   Details  aspirin EC 81 MG tablet Take 81 mg by mouth daily., Until Discontinued, Historical Med    Calcium Carbonate-Vitamin D (CALCIUM 600+D) 600-200 MG-UNIT TABS Take 1 tablet by mouth daily., Until Discontinued, Historical Med    citalopram (CELEXA) 10 MG tablet Take 1 tablet by mouth every evening. Take around 5pm to 7pm., Starting 05/31/2015, Until Discontinued, Historical Med    clopidogrel (PLAVIX) 75 MG tablet Take 1 tablet by mouth daily., Starting 05/06/2015, Until Discontinued, Historical Med    folic acid (FOLVITE) 1 MG tablet Take 1 tablet by mouth daily., Starting 03/30/2015, Until Discontinued, Historical Med    gabapentin (NEURONTIN) 100 MG capsule Take 100 mg by mouth 3 (three) times daily., Until Discontinued, Historical Med    isosorbide mononitrate (ISMO,MONOKET) 20 MG tablet Take 1 tablet by mouth daily., Starting 06/04/2015, Until Discontinued, Historical Med    losartan (COZAAR) 100 MG tablet Take 1 tablet by mouth daily., Starting 04/06/2015, Until Discontinued, Historical Med    lovastatin (MEVACOR) 20 MG tablet Take 1 tablet by mouth at bedtime., Starting 04/06/2015, Until Discontinued, Historical Med    metFORMIN (GLUCOPHAGE) 500 MG tablet Take 500-1,000 mg by mouth 2 (two) times daily. Take 2 tablets (1000 mg) in the morning and take 1 tablet (500 mg) in the evening., Starting 05/06/2015, Until Discontinued, Historical Med    nitrofurantoin, macrocrystal-monohydrate, (MACROBID) 100 MG capsule Take 100 mg  by mouth daily. Take 1 capsule on Monday, Wednesday, and Friday for UTI prophylaxis., Until Discontinued, Historical Med    omeprazole (PRILOSEC) 20 MG capsule Take 20 mg by mouth daily., Until Discontinued, Historical Med    ondansetron (ZOFRAN ODT) 4 MG disintegrating tablet Take 1 tablet (4  mg total) by mouth every 8 (eight) hours as needed for nausea or vomiting., Starting 06/15/2015, Until Discontinued, Print    pioglitazone (ACTOS) 15 MG tablet Take 1 tablet by mouth daily., Starting 05/06/2015, Until Discontinued, Historical Med    CRANBERRY PO Take 1 tablet by mouth daily., Until Discontinued, Historical Med         DISCHARGE INSTRUCTIONS:    Follow with orthopedic clinic in 2 weeks.  If you experience worsening of your admission symptoms, develop shortness of breath, life threatening emergency, suicidal or homicidal thoughts you must seek medical attention immediately by calling 911 or calling your MD immediately  if symptoms less severe.  You Must read complete instructions/literature along with all the possible adverse reactions/side effects for all the Medicines you take and that have been prescribed to you. Take any new Medicines after you have completely understood and accept all the possible adverse reactions/side effects.   Please note  You were cared for by a hospitalist during your hospital stay. If you have any questions about your discharge medications or the care you received while you were in the hospital after you are discharged, you can call the unit and asked to speak with the hospitalist on call if the hospitalist that took care of you is not available. Once you are discharged, your primary care physician will handle any further medical issues. Please note that NO REFILLS for any discharge medications will be authorized once you are discharged, as it is imperative that you return to your primary care physician (or establish a relationship with a primary care physician if you do not have one) for your aftercare needs so that they can reassess your need for medications and monitor your lab values.    Today   CHIEF COMPLAINT:   Chief Complaint  Patient presents with  . Fall    HISTORY OF PRESENT ILLNESS:  Angela Vargas  is a 82 y.o. female with a  known history of diabetes, coronary artery disease status post CABG- lives independent life with her family and able to walk with a walker, tonight early morning at 1:00 she got up to go to the bathroom and accidentally fall down and since then having pain in her lower back so brought to the emergency room by family. She was found to have T 12 vertebral compression fracture. An due to pain she was not able to walk so given his admission to hospitalist team. Her grandson is present in the room at the time of my interview and he and patient both denies having any associated palpitation, chest pain, dizziness, loss of consciousness associated with the fall.   VITAL SIGNS:  Blood pressure 129/47, pulse 96, temperature 98.5 F (36.9 C), temperature source Oral, resp. rate 18, height 5' (1.524 m), weight 55.067 kg (121 lb 6.4 oz), SpO2 96 %.  I/O:  No intake or output data in the 24 hours ending 07/05/15 0738  PHYSICAL EXAMINATION:  GENERAL:  82 y.o.-year-old patient lying in the bed with no acute distress.  EYES: Pupils equal, round, reactive to light and accommodation. No scleral icterus. Extraocular muscles intact.  HEENT: Head atraumatic, normocephalic. Oropharynx and nasopharynx clear.  NECK:  Supple, no  jugular venous distention. No thyroid enlargement, no tenderness.  LUNGS: Normal breath sounds bilaterally, no wheezing, rales,rhonchi or crepitation. No use of accessory muscles of respiration.  CARDIOVASCULAR: S1, S2 normal. No murmurs, rubs, or gallops.  ABDOMEN: Soft, non-tender, non-distended. Bowel sounds present. No organomegaly or mass.  EXTREMITIES: No pedal edema, cyanosis, or clubbing.  NEUROLOGIC: Cranial nerves II through XII are intact. Muscle strength 5/5 in all extremities. Sensation intact. Gait not checked.  PSYCHIATRIC: The patient is alert and oriented x 3.  SKIN: No obvious rash, lesion, or ulcer.   DATA REVIEW:   CBC  Recent Labs Lab 07/03/15 0103  WBC 4.5  HGB  10.2*  HCT 30.5*  PLT 253    Chemistries   Recent Labs Lab 07/02/15 1842 07/03/15 0103  NA 128* 128*  K 4.1 3.8  CL 94* 96*  CO2 28 27  GLUCOSE 132* 168*  BUN 11 10  CREATININE 0.84 0.76  CALCIUM 8.5* 8.3*  AST 21  --   ALT 10*  --   ALKPHOS 60  --   BILITOT 1.0  --     Cardiac Enzymes  Recent Labs Lab 07/03/15 1018  TROPONINI 0.03    Microbiology Results  Results for orders placed or performed in visit on 09/05/13  Clostridium Difficile [facility])     Status: None   Collection Time: 09/07/13  1:57 PM  Result Value Ref Range Status   Micro Text Report   Final       C.DIFFICILE ANTIGEN       C.DIFFICILE GDH ANTIGEN : NEGATIVE   C.DIFFICILE TOXIN A/B     C.DIFFICILE TOXINS A AND B : NEGATIVE   INTERPRETATION            Negative for C. difficile.    ANTIBIOTIC                                                        RADIOLOGY:  No results found.  EKG:   Orders placed or performed during the hospital encounter of 06/28/15  . ED EKG  . ED EKG      Management plans discussed with the patient, family and they are in agreement.  CODE STATUS:  Code Status History    Date Active Date Inactive Code Status Order ID Comments User Context   07/02/2015  9:31 PM 07/05/2015  1:34 AM Full Code [id number]  Oralia Manisavid Willis, MD ED   06/28/2015  9:18 AM 06/29/2015  6:28 PM Full Code [id number]  Altamese DillingVaibhavkumar Cosimo Schertzer, MD Inpatient      TOTAL TIME TAKING CARE OF THIS PATIENT: 35 minutes.    Altamese DillingVACHHANI, Janthony Holleman M.D on 07/05/2015 at 7:38 AM  Between 7am to 6pm - Pager - [phone]  After 6pm go to [email or web address] - password [email or web address] [facility]  [facility]  Office  [phone]  CC: Primary care physician; Lyndon CodeKHAN, FOZIA M, MD   Note: This dictation was prepared with Dragon dictation along with smaller phrase technology. Any transcriptional errors that result from this process are unintentional.

## 2015-07-06 DIAGNOSIS — M8008XD Age-related osteoporosis with current pathological fracture, vertebra(e), subsequent encounter for fracture with routine healing: Secondary | ICD-10-CM | POA: Diagnosis not present

## 2015-07-06 DIAGNOSIS — I10 Essential (primary) hypertension: Secondary | ICD-10-CM | POA: Diagnosis not present

## 2015-07-06 DIAGNOSIS — I272 Other secondary pulmonary hypertension: Secondary | ICD-10-CM | POA: Diagnosis not present

## 2015-07-06 DIAGNOSIS — I2510 Atherosclerotic heart disease of native coronary artery without angina pectoris: Secondary | ICD-10-CM | POA: Diagnosis not present

## 2015-07-06 DIAGNOSIS — W19XXXD Unspecified fall, subsequent encounter: Secondary | ICD-10-CM | POA: Diagnosis not present

## 2015-07-06 DIAGNOSIS — E119 Type 2 diabetes mellitus without complications: Secondary | ICD-10-CM | POA: Diagnosis not present

## 2015-07-07 DIAGNOSIS — I272 Other secondary pulmonary hypertension: Secondary | ICD-10-CM | POA: Diagnosis not present

## 2015-07-07 DIAGNOSIS — I2510 Atherosclerotic heart disease of native coronary artery without angina pectoris: Secondary | ICD-10-CM | POA: Diagnosis not present

## 2015-07-07 DIAGNOSIS — M8008XD Age-related osteoporosis with current pathological fracture, vertebra(e), subsequent encounter for fracture with routine healing: Secondary | ICD-10-CM | POA: Diagnosis not present

## 2015-07-07 DIAGNOSIS — W19XXXD Unspecified fall, subsequent encounter: Secondary | ICD-10-CM | POA: Diagnosis not present

## 2015-07-07 DIAGNOSIS — I10 Essential (primary) hypertension: Secondary | ICD-10-CM | POA: Diagnosis not present

## 2015-07-07 DIAGNOSIS — E119 Type 2 diabetes mellitus without complications: Secondary | ICD-10-CM | POA: Diagnosis not present

## 2015-07-08 DIAGNOSIS — I272 Other secondary pulmonary hypertension: Secondary | ICD-10-CM | POA: Diagnosis not present

## 2015-07-08 DIAGNOSIS — I10 Essential (primary) hypertension: Secondary | ICD-10-CM | POA: Diagnosis not present

## 2015-07-08 DIAGNOSIS — W19XXXD Unspecified fall, subsequent encounter: Secondary | ICD-10-CM | POA: Diagnosis not present

## 2015-07-08 DIAGNOSIS — E119 Type 2 diabetes mellitus without complications: Secondary | ICD-10-CM | POA: Diagnosis not present

## 2015-07-08 DIAGNOSIS — M8008XD Age-related osteoporosis with current pathological fracture, vertebra(e), subsequent encounter for fracture with routine healing: Secondary | ICD-10-CM | POA: Diagnosis not present

## 2015-07-08 DIAGNOSIS — I2510 Atherosclerotic heart disease of native coronary artery without angina pectoris: Secondary | ICD-10-CM | POA: Diagnosis not present

## 2015-07-09 DIAGNOSIS — I2510 Atherosclerotic heart disease of native coronary artery without angina pectoris: Secondary | ICD-10-CM | POA: Diagnosis not present

## 2015-07-09 DIAGNOSIS — I272 Other secondary pulmonary hypertension: Secondary | ICD-10-CM | POA: Diagnosis not present

## 2015-07-09 DIAGNOSIS — W19XXXD Unspecified fall, subsequent encounter: Secondary | ICD-10-CM | POA: Diagnosis not present

## 2015-07-09 DIAGNOSIS — E119 Type 2 diabetes mellitus without complications: Secondary | ICD-10-CM | POA: Diagnosis not present

## 2015-07-09 DIAGNOSIS — M8008XD Age-related osteoporosis with current pathological fracture, vertebra(e), subsequent encounter for fracture with routine healing: Secondary | ICD-10-CM | POA: Diagnosis not present

## 2015-07-09 DIAGNOSIS — I10 Essential (primary) hypertension: Secondary | ICD-10-CM | POA: Diagnosis not present

## 2015-07-10 DIAGNOSIS — I10 Essential (primary) hypertension: Secondary | ICD-10-CM | POA: Diagnosis not present

## 2015-07-10 DIAGNOSIS — I2510 Atherosclerotic heart disease of native coronary artery without angina pectoris: Secondary | ICD-10-CM | POA: Diagnosis not present

## 2015-07-10 DIAGNOSIS — E119 Type 2 diabetes mellitus without complications: Secondary | ICD-10-CM | POA: Diagnosis not present

## 2015-07-10 DIAGNOSIS — M8008XD Age-related osteoporosis with current pathological fracture, vertebra(e), subsequent encounter for fracture with routine healing: Secondary | ICD-10-CM | POA: Diagnosis not present

## 2015-07-10 DIAGNOSIS — I272 Other secondary pulmonary hypertension: Secondary | ICD-10-CM | POA: Diagnosis not present

## 2015-07-10 DIAGNOSIS — W19XXXD Unspecified fall, subsequent encounter: Secondary | ICD-10-CM | POA: Diagnosis not present

## 2015-07-12 DIAGNOSIS — I10 Essential (primary) hypertension: Secondary | ICD-10-CM | POA: Diagnosis not present

## 2015-07-12 DIAGNOSIS — E119 Type 2 diabetes mellitus without complications: Secondary | ICD-10-CM | POA: Diagnosis not present

## 2015-07-12 DIAGNOSIS — I2510 Atherosclerotic heart disease of native coronary artery without angina pectoris: Secondary | ICD-10-CM | POA: Diagnosis not present

## 2015-07-12 DIAGNOSIS — I272 Other secondary pulmonary hypertension: Secondary | ICD-10-CM | POA: Diagnosis not present

## 2015-07-12 DIAGNOSIS — W19XXXD Unspecified fall, subsequent encounter: Secondary | ICD-10-CM | POA: Diagnosis not present

## 2015-07-12 DIAGNOSIS — M8008XD Age-related osteoporosis with current pathological fracture, vertebra(e), subsequent encounter for fracture with routine healing: Secondary | ICD-10-CM | POA: Diagnosis not present

## 2015-07-13 DIAGNOSIS — I2510 Atherosclerotic heart disease of native coronary artery without angina pectoris: Secondary | ICD-10-CM | POA: Diagnosis not present

## 2015-07-13 DIAGNOSIS — I10 Essential (primary) hypertension: Secondary | ICD-10-CM | POA: Diagnosis not present

## 2015-07-13 DIAGNOSIS — W19XXXD Unspecified fall, subsequent encounter: Secondary | ICD-10-CM | POA: Diagnosis not present

## 2015-07-13 DIAGNOSIS — M8008XD Age-related osteoporosis with current pathological fracture, vertebra(e), subsequent encounter for fracture with routine healing: Secondary | ICD-10-CM | POA: Diagnosis not present

## 2015-07-13 DIAGNOSIS — E119 Type 2 diabetes mellitus without complications: Secondary | ICD-10-CM | POA: Diagnosis not present

## 2015-07-13 DIAGNOSIS — I272 Other secondary pulmonary hypertension: Secondary | ICD-10-CM | POA: Diagnosis not present

## 2015-07-14 DIAGNOSIS — I272 Other secondary pulmonary hypertension: Secondary | ICD-10-CM | POA: Diagnosis not present

## 2015-07-14 DIAGNOSIS — M8008XD Age-related osteoporosis with current pathological fracture, vertebra(e), subsequent encounter for fracture with routine healing: Secondary | ICD-10-CM | POA: Diagnosis not present

## 2015-07-14 DIAGNOSIS — W19XXXD Unspecified fall, subsequent encounter: Secondary | ICD-10-CM | POA: Diagnosis not present

## 2015-07-14 DIAGNOSIS — I10 Essential (primary) hypertension: Secondary | ICD-10-CM | POA: Diagnosis not present

## 2015-07-14 DIAGNOSIS — E119 Type 2 diabetes mellitus without complications: Secondary | ICD-10-CM | POA: Diagnosis not present

## 2015-07-14 DIAGNOSIS — I2510 Atherosclerotic heart disease of native coronary artery without angina pectoris: Secondary | ICD-10-CM | POA: Diagnosis not present

## 2015-07-15 DIAGNOSIS — E119 Type 2 diabetes mellitus without complications: Secondary | ICD-10-CM | POA: Diagnosis not present

## 2015-07-15 DIAGNOSIS — I2510 Atherosclerotic heart disease of native coronary artery without angina pectoris: Secondary | ICD-10-CM | POA: Diagnosis not present

## 2015-07-15 DIAGNOSIS — W19XXXD Unspecified fall, subsequent encounter: Secondary | ICD-10-CM | POA: Diagnosis not present

## 2015-07-15 DIAGNOSIS — I272 Other secondary pulmonary hypertension: Secondary | ICD-10-CM | POA: Diagnosis not present

## 2015-07-15 DIAGNOSIS — I10 Essential (primary) hypertension: Secondary | ICD-10-CM | POA: Diagnosis not present

## 2015-07-15 DIAGNOSIS — M8008XD Age-related osteoporosis with current pathological fracture, vertebra(e), subsequent encounter for fracture with routine healing: Secondary | ICD-10-CM | POA: Diagnosis not present

## 2015-07-16 DIAGNOSIS — W19XXXD Unspecified fall, subsequent encounter: Secondary | ICD-10-CM | POA: Diagnosis not present

## 2015-07-16 DIAGNOSIS — E119 Type 2 diabetes mellitus without complications: Secondary | ICD-10-CM | POA: Diagnosis not present

## 2015-07-16 DIAGNOSIS — I2510 Atherosclerotic heart disease of native coronary artery without angina pectoris: Secondary | ICD-10-CM | POA: Diagnosis not present

## 2015-07-16 DIAGNOSIS — I10 Essential (primary) hypertension: Secondary | ICD-10-CM | POA: Diagnosis not present

## 2015-07-16 DIAGNOSIS — I272 Other secondary pulmonary hypertension: Secondary | ICD-10-CM | POA: Diagnosis not present

## 2015-07-16 DIAGNOSIS — M8008XD Age-related osteoporosis with current pathological fracture, vertebra(e), subsequent encounter for fracture with routine healing: Secondary | ICD-10-CM | POA: Diagnosis not present

## 2015-07-19 DIAGNOSIS — M8008XD Age-related osteoporosis with current pathological fracture, vertebra(e), subsequent encounter for fracture with routine healing: Secondary | ICD-10-CM | POA: Diagnosis not present

## 2015-07-19 DIAGNOSIS — I10 Essential (primary) hypertension: Secondary | ICD-10-CM | POA: Diagnosis not present

## 2015-07-19 DIAGNOSIS — I2510 Atherosclerotic heart disease of native coronary artery without angina pectoris: Secondary | ICD-10-CM | POA: Diagnosis not present

## 2015-07-19 DIAGNOSIS — E119 Type 2 diabetes mellitus without complications: Secondary | ICD-10-CM | POA: Diagnosis not present

## 2015-07-19 DIAGNOSIS — I272 Other secondary pulmonary hypertension: Secondary | ICD-10-CM | POA: Diagnosis not present

## 2015-07-19 DIAGNOSIS — W19XXXD Unspecified fall, subsequent encounter: Secondary | ICD-10-CM | POA: Diagnosis not present

## 2015-07-20 DIAGNOSIS — E119 Type 2 diabetes mellitus without complications: Secondary | ICD-10-CM | POA: Diagnosis not present

## 2015-07-20 DIAGNOSIS — I272 Other secondary pulmonary hypertension: Secondary | ICD-10-CM | POA: Diagnosis not present

## 2015-07-20 DIAGNOSIS — W19XXXD Unspecified fall, subsequent encounter: Secondary | ICD-10-CM | POA: Diagnosis not present

## 2015-07-20 DIAGNOSIS — M8008XD Age-related osteoporosis with current pathological fracture, vertebra(e), subsequent encounter for fracture with routine healing: Secondary | ICD-10-CM | POA: Diagnosis not present

## 2015-07-20 DIAGNOSIS — I2510 Atherosclerotic heart disease of native coronary artery without angina pectoris: Secondary | ICD-10-CM | POA: Diagnosis not present

## 2015-07-20 DIAGNOSIS — I10 Essential (primary) hypertension: Secondary | ICD-10-CM | POA: Diagnosis not present

## 2015-07-21 DIAGNOSIS — I2510 Atherosclerotic heart disease of native coronary artery without angina pectoris: Secondary | ICD-10-CM | POA: Diagnosis not present

## 2015-07-21 DIAGNOSIS — E119 Type 2 diabetes mellitus without complications: Secondary | ICD-10-CM | POA: Diagnosis not present

## 2015-07-21 DIAGNOSIS — I272 Other secondary pulmonary hypertension: Secondary | ICD-10-CM | POA: Diagnosis not present

## 2015-07-21 DIAGNOSIS — W19XXXD Unspecified fall, subsequent encounter: Secondary | ICD-10-CM | POA: Diagnosis not present

## 2015-07-21 DIAGNOSIS — M8008XD Age-related osteoporosis with current pathological fracture, vertebra(e), subsequent encounter for fracture with routine healing: Secondary | ICD-10-CM | POA: Diagnosis not present

## 2015-07-21 DIAGNOSIS — I10 Essential (primary) hypertension: Secondary | ICD-10-CM | POA: Diagnosis not present

## 2015-07-23 DIAGNOSIS — W19XXXD Unspecified fall, subsequent encounter: Secondary | ICD-10-CM | POA: Diagnosis not present

## 2015-07-23 DIAGNOSIS — M8008XD Age-related osteoporosis with current pathological fracture, vertebra(e), subsequent encounter for fracture with routine healing: Secondary | ICD-10-CM | POA: Diagnosis not present

## 2015-07-23 DIAGNOSIS — I2510 Atherosclerotic heart disease of native coronary artery without angina pectoris: Secondary | ICD-10-CM | POA: Diagnosis not present

## 2015-07-23 DIAGNOSIS — E119 Type 2 diabetes mellitus without complications: Secondary | ICD-10-CM | POA: Diagnosis not present

## 2015-07-23 DIAGNOSIS — I272 Other secondary pulmonary hypertension: Secondary | ICD-10-CM | POA: Diagnosis not present

## 2015-07-23 DIAGNOSIS — I10 Essential (primary) hypertension: Secondary | ICD-10-CM | POA: Diagnosis not present

## 2015-07-26 ENCOUNTER — Emergency Department
Admission: EM | Admit: 2015-07-26 | Discharge: 2015-07-27 | Disposition: A | Discharge status: Home / Self Care | Payer: Medicare Other | Attending: Emergency Medicine | Admitting: Emergency Medicine

## 2015-07-26 ENCOUNTER — Encounter: Payer: Self-pay | Admitting: Emergency Medicine

## 2015-07-26 DIAGNOSIS — Z7984 Long term (current) use of oral hypoglycemic drugs: Secondary | ICD-10-CM | POA: Insufficient documentation

## 2015-07-26 DIAGNOSIS — R4781 Slurred speech: Secondary | ICD-10-CM | POA: Diagnosis not present

## 2015-07-26 DIAGNOSIS — I10 Essential (primary) hypertension: Secondary | ICD-10-CM | POA: Diagnosis not present

## 2015-07-26 DIAGNOSIS — R4789 Other speech disturbances: Secondary | ICD-10-CM | POA: Diagnosis not present

## 2015-07-26 DIAGNOSIS — E860 Dehydration: Secondary | ICD-10-CM | POA: Insufficient documentation

## 2015-07-26 DIAGNOSIS — K449 Diaphragmatic hernia without obstruction or gangrene: Secondary | ICD-10-CM | POA: Diagnosis not present

## 2015-07-26 DIAGNOSIS — Z79899 Other long term (current) drug therapy: Secondary | ICD-10-CM | POA: Insufficient documentation

## 2015-07-26 DIAGNOSIS — I252 Old myocardial infarction: Secondary | ICD-10-CM | POA: Insufficient documentation

## 2015-07-26 DIAGNOSIS — M8008XD Age-related osteoporosis with current pathological fracture, vertebra(e), subsequent encounter for fracture with routine healing: Secondary | ICD-10-CM | POA: Diagnosis not present

## 2015-07-26 DIAGNOSIS — E119 Type 2 diabetes mellitus without complications: Secondary | ICD-10-CM | POA: Diagnosis not present

## 2015-07-26 DIAGNOSIS — I272 Other secondary pulmonary hypertension: Secondary | ICD-10-CM | POA: Diagnosis not present

## 2015-07-26 DIAGNOSIS — I110 Hypertensive heart disease with heart failure: Secondary | ICD-10-CM | POA: Insufficient documentation

## 2015-07-26 DIAGNOSIS — I509 Heart failure, unspecified: Secondary | ICD-10-CM | POA: Insufficient documentation

## 2015-07-26 DIAGNOSIS — R1110 Vomiting, unspecified: Secondary | ICD-10-CM | POA: Diagnosis present

## 2015-07-26 DIAGNOSIS — R1111 Vomiting without nausea: Secondary | ICD-10-CM

## 2015-07-26 DIAGNOSIS — W19XXXD Unspecified fall, subsequent encounter: Secondary | ICD-10-CM | POA: Diagnosis not present

## 2015-07-26 DIAGNOSIS — K219 Gastro-esophageal reflux disease without esophagitis: Secondary | ICD-10-CM | POA: Insufficient documentation

## 2015-07-26 DIAGNOSIS — Z7982 Long term (current) use of aspirin: Secondary | ICD-10-CM | POA: Insufficient documentation

## 2015-07-26 DIAGNOSIS — I2510 Atherosclerotic heart disease of native coronary artery without angina pectoris: Secondary | ICD-10-CM | POA: Diagnosis not present

## 2015-07-26 LAB — CBC WITH DIFFERENTIAL/PLATELET
Basophils Absolute: 0 10*3/uL (ref 0–0.1)
Basophils Relative: 1 %
EOS ABS: 0 10*3/uL (ref 0–0.7)
Eosinophils Relative: 1 %
HCT: 35.5 % (ref 35.0–47.0)
HEMOGLOBIN: 11.8 g/dL — AB (ref 12.0–16.0)
LYMPHS ABS: 0.9 10*3/uL — AB (ref 1.0–3.6)
Lymphocytes Relative: 16 %
MCH: 29.2 pg (ref 26.0–34.0)
MCHC: 33.2 g/dL (ref 32.0–36.0)
MCV: 88 fL (ref 80.0–100.0)
MONOS PCT: 8 %
Monocytes Absolute: 0.4 10*3/uL (ref 0.2–0.9)
NEUTROS PCT: 74 %
Neutro Abs: 3.9 10*3/uL (ref 1.4–6.5)
Platelets: 213 10*3/uL (ref 150–440)
RBC: 4.03 MIL/uL (ref 3.80–5.20)
RDW: 18.4 % — ABNORMAL HIGH (ref 11.5–14.5)
WBC: 5.3 10*3/uL (ref 3.6–11.0)

## 2015-07-26 LAB — COMPREHENSIVE METABOLIC PANEL
ALBUMIN: 3.5 g/dL (ref 3.5–5.0)
ALK PHOS: 74 U/L (ref 38–126)
ALT: 9 U/L — AB (ref 14–54)
ANION GAP: 5 (ref 5–15)
AST: 20 U/L (ref 15–41)
BUN: 13 mg/dL (ref 6–20)
CALCIUM: 8.9 mg/dL (ref 8.9–10.3)
CO2: 24 mmol/L (ref 22–32)
CREATININE: 0.84 mg/dL (ref 0.44–1.00)
Chloride: 99 mmol/L — ABNORMAL LOW (ref 101–111)
GFR calc Af Amer: >60 mL/min (ref >60)
GFR calc non Af Amer: 59 mL/min — ABNORMAL LOW (ref >60)
GLUCOSE: 123 mg/dL — AB (ref 65–99)
Potassium: 4.7 mmol/L (ref 3.5–5.1)
SODIUM: 128 mmol/L — AB (ref 135–145)
Total Bilirubin: 0.5 mg/dL (ref 0.3–1.2)
Total Protein: 6.7 g/dL (ref 6.5–8.1)

## 2015-07-26 LAB — TROPONIN I

## 2015-07-26 LAB — LIPASE, BLOOD: Lipase: 19 U/L (ref 11–51)

## 2015-07-26 MED ORDER — SODIUM CHLORIDE 0.9 % IV BOLUS (SEPSIS)
1000.0000 mL | Freq: Once | INTRAVENOUS | Status: AC
  Administered 2015-07-26: 1000 mL via INTRAVENOUS

## 2015-07-26 NOTE — ED Notes (Signed)
Patient's daughter states that while patient was brushing her teeth this evening, patient vomited large amount, became weak, diaphoretic and had slurred speech, episode lasting 20 minutes.  On EMS arrival patient was in bed c/o general weakness.  AAOx3.  Skin warm and dry.  Speech wnl.   Family reports a several month history of decreased appetite and decreased oral intake.  States patient has only eaten a small amount of soup and bread today.  Patient's mucous membranes are dry.

## 2015-07-27 ENCOUNTER — Emergency Department: Payer: Medicare Other

## 2015-07-27 DIAGNOSIS — R4781 Slurred speech: Secondary | ICD-10-CM | POA: Diagnosis not present

## 2015-07-27 DIAGNOSIS — K449 Diaphragmatic hernia without obstruction or gangrene: Secondary | ICD-10-CM | POA: Diagnosis not present

## 2015-07-27 DIAGNOSIS — M8008XD Age-related osteoporosis with current pathological fracture, vertebra(e), subsequent encounter for fracture with routine healing: Secondary | ICD-10-CM | POA: Diagnosis not present

## 2015-07-27 DIAGNOSIS — W19XXXD Unspecified fall, subsequent encounter: Secondary | ICD-10-CM | POA: Diagnosis not present

## 2015-07-27 DIAGNOSIS — I272 Other secondary pulmonary hypertension: Secondary | ICD-10-CM | POA: Diagnosis not present

## 2015-07-27 DIAGNOSIS — E119 Type 2 diabetes mellitus without complications: Secondary | ICD-10-CM | POA: Diagnosis not present

## 2015-07-27 DIAGNOSIS — I2510 Atherosclerotic heart disease of native coronary artery without angina pectoris: Secondary | ICD-10-CM | POA: Diagnosis not present

## 2015-07-27 DIAGNOSIS — I10 Essential (primary) hypertension: Secondary | ICD-10-CM | POA: Diagnosis not present

## 2015-07-27 DIAGNOSIS — E860 Dehydration: Secondary | ICD-10-CM | POA: Diagnosis not present

## 2015-07-27 LAB — URINALYSIS COMPLETE WITH MICROSCOPIC (ARMC ONLY)
BILIRUBIN URINE: NEGATIVE
Bacteria, UA: NONE SEEN
GLUCOSE, UA: 150 mg/dL — AB
HGB URINE DIPSTICK: NEGATIVE
Ketones, ur: NEGATIVE mg/dL
LEUKOCYTES UA: NEGATIVE
NITRITE: NEGATIVE
Protein, ur: NEGATIVE mg/dL
Specific Gravity, Urine: 1.02 (ref 1.005–1.030)
pH: 5 (ref 5.0–8.0)

## 2015-07-27 MED ORDER — DIATRIZOATE MEGLUMINE & SODIUM 66-10 % PO SOLN
15.0000 mL | Freq: Once | ORAL | Status: DC
  Filled 2015-07-27: qty 30

## 2015-07-27 MED ORDER — IOPAMIDOL (ISOVUE-300) INJECTION 61%
100.0000 mL | Freq: Once | INTRAVENOUS | Status: AC | PRN
  Administered 2015-07-27: 100 mL via INTRAVENOUS

## 2015-07-27 NOTE — ED Provider Notes (Signed)
[facility] Emergency Department Provider Note  ____________________________________________  Time seen: 11:30 PM  I have reviewed the triage vital signs and the nursing notes.   HISTORY  Chief Complaint Emesis History obtained from patient and family at bedside   HPI Angela Vargas is a 82 y.o. female who is in her usual state of health, and this evening when she was brushing her teeth she gagged and vomited once. She became weak diaphoretic and seemed to have trouble speaking during that time. She then returned to her baseline but is been generally weak. They state she has been otherwise doing well, ambulating with her walker, eating and drinking small amounts as per her usual over the last few weeks. 2 weeks ago she did have a fall with head injury requiring wound repair. She's been compliant with all medications.     Past Medical History  Diagnosis Date  . Diabetes mellitus without complication (HCC)   . Myocardial infarct (HCC)   . Hypertension   . Compression fracture of T12 vertebra (HCC)   . Pulmonary hypertension (HCC)   . CAD (coronary artery disease)   . Knee osteoarthritis   . GERD (gastroesophageal reflux disease)      Patient Active Problem List   Diagnosis Date Noted  . Protein-calorie malnutrition, severe 07/04/2015  . Chest pain 07/02/2015  . Acute CHF (congestive heart failure) (HCC) 07/02/2015  . GERD (gastroesophageal reflux disease) 07/02/2015  . Type 2 diabetes mellitus (HCC) 07/02/2015  . HTN (hypertension) 07/02/2015  . CAD (coronary artery disease) 07/02/2015  . Vertebral fracture, osteoporotic (HCC) 06/28/2015     Past Surgical History  Procedure Laterality Date  . Stent placement iliac ([facility] hx)       Current Outpatient Rx  Name  Route  Sig  Dispense  Refill  . acetaminophen (TYLENOL) 500 MG tablet   Oral   Take 1-2 tablets (500-1,000 mg total) by mouth every 6 (six) hours as needed for moderate pain.   30  tablet   0   . aspirin EC 81 MG tablet   Oral   Take 81 mg by mouth daily.         . Calcium Carbonate-Vitamin D (CALCIUM 600+D) 600-200 MG-UNIT TABS   Oral   Take 1 tablet by mouth daily.         . citalopram (CELEXA) 10 MG tablet   Oral   Take 10 mg by mouth at bedtime.       3   . clopidogrel (PLAVIX) 75 MG tablet   Oral   Take 75 mg by mouth daily.       3   . Cranberry 1000 MG CAPS   Oral   Take 1,000 mg by mouth daily.         . cyanocobalamin (,VITAMIN B-12,) 1000 MCG/ML injection   Intramuscular   Inject 1,000 mcg into the muscle every 30 (thirty) days.         . feeding supplement, ENSURE ENLIVE, (ENSURE ENLIVE) LIQD   Oral   Take 237 mLs by mouth 2 (two) times daily between meals.   237 mL   12   . folic acid (FOLVITE) 1 MG tablet   Oral   Take 1 mg by mouth daily.       3   . gabapentin (NEURONTIN) 100 MG capsule   Oral   Take 100 mg by mouth 3 (three) times daily.         Marland Kitchen ibuprofen (  MOTRIN IB) 200 MG tablet   Oral   Take 1 tablet (200 mg total) by mouth every 8 (eight) hours as needed for moderate pain or cramping.   30 tablet   0   . isosorbide mononitrate (ISMO,MONOKET) 20 MG tablet   Oral   Take 20 mg by mouth daily.       3   . losartan (COZAAR) 100 MG tablet   Oral   Take 100 mg by mouth daily.       3   . lovastatin (MEVACOR) 20 MG tablet   Oral   Take 20 mg by mouth at bedtime.       3   . metFORMIN (GLUCOPHAGE) 500 MG tablet   Oral   Take 500 mg by mouth 3 (three) times daily.       3   . metoprolol tartrate (LOPRESSOR) 25 MG tablet   Oral   Take 1 tablet (25 mg total) by mouth 2 (two) times daily.   60 tablet   0   . nitrofurantoin, macrocrystal-monohydrate, (MACROBID) 100 MG capsule   Oral   Take 100 mg by mouth every Monday, Wednesday, and Friday.          Marland Kitchen omeprazole (PRILOSEC) 20 MG capsule   Oral   Take 20 mg by mouth daily.         . ondansetron (ZOFRAN ODT) 4 MG disintegrating  tablet   Oral   Take 1 tablet (4 mg total) by mouth every 8 (eight) hours as needed for nausea or vomiting.   20 tablet   0   . pioglitazone (ACTOS) 15 MG tablet   Oral   Take 15 mg by mouth daily.       2      Allergies Sulfa antibiotics   Family History  Problem Relation Age of Onset  . Hypertension Father     Social History Social History  Substance Use Topics  . Smoking status: Never Smoker   . Smokeless tobacco: None  . Alcohol Use: No    Review of Systems  Constitutional:   No fever or chills.  Eyes:   No vision changes.  ENT:   No sore throat. No rhinorrhea. Cardiovascular:   No chest pain. Respiratory:   No dyspnea or cough. Gastrointestinal:   Negative for abdominal pain, Positive vomiting today. Genitourinary:   Negative for dysuria or difficulty urinating. Musculoskeletal:   Negative for focal pain or swelling Neurological:   Negative for headaches. Recent episode of slurred speech 10-point ROS otherwise negative.  ____________________________________________   PHYSICAL EXAM:  VITAL SIGNS: ED Triage Vitals  Enc Vitals Group     BP 07/26/15 2246 189/96 mmHg     Pulse Rate 07/26/15 2246 72     Resp 07/26/15 2246 11     Temp 07/26/15 2246 98.4 F (36.9 C)     Temp Source 07/26/15 2246 Oral     SpO2 07/26/15 2246 96 %     Weight 07/26/15 2246 127 lb (57.607 kg)     Height --      Head Cir --      Peak Flow --      Pain Score 07/26/15 2250 0     Pain Loc --      Pain Edu? --      Excl. in GC? --     Vital signs reviewed, nursing assessments reviewed.   Constitutional:   Alert and oriented. Well appearing and in no distress.Calm  and comfortable Eyes:   No scleral icterus. No conjunctival pallor. PERRL. EOMI ENT   Head:   Normocephalic and atraumatic.   Nose:   No congestion/rhinnorhea. No septal hematoma   Mouth/Throat:   Dry mucous membranes, no pharyngeal erythema. No peritonsillar mass.    Neck:   No stridor. No SubQ  emphysema. No meningismus. Hematological/Lymphatic/Immunilogical:   No cervical lymphadenopathy. Cardiovascular:   RRR. Symmetric bilateral radial and DP pulses.  No murmurs.  Respiratory:   Normal respiratory effort without tachypnea nor retractions. Breath sounds are clear and equal bilaterally. No wheezes/rales/rhonchi. Gastrointestinal:   Soft with diffuse mild tenderness. Non distended. There is no CVA tenderness.  No rebound, rigidity, or guarding. No bruit or pulsatile mass Genitourinary:   deferred Musculoskeletal:   Nontender with normal range of motion in all extremities. No joint effusions.  No lower extremity tenderness.  No edema. Neurologic:   Normal speech and language.  CN 2-10 normal. Motor grossly intact. No gross focal neurologic deficits are appreciated.  Skin:    Skin is warm, dry and intact. No rash noted.  No petechiae, purpura, or bullae. Poor turgor  ____________________________________________    LABS (pertinent positives/negatives) (all labs ordered are listed, but only abnormal results are displayed) Labs Reviewed  COMPREHENSIVE METABOLIC PANEL - Abnormal; Notable for the following:    Sodium 128 (*)    Chloride 99 (*)    Glucose, Bld 123 (*)    ALT 9 (*)    GFR calc non Af Amer 59 (*)    All other components within normal limits  CBC WITH DIFFERENTIAL/PLATELET - Abnormal; Notable for the following:    Hemoglobin 11.8 (*)    RDW 18.4 (*)    Lymphs Abs 0.9 (*)    All other components within normal limits  URINALYSIS COMPLETEWITH MICROSCOPIC ([facility] ONLY) - Abnormal; Notable for the following:    Color, Urine STRAW (*)    APPearance CLEAR (*)    Glucose, UA 150 (*)    Squamous Epithelial / LPF 0-5 (*)    All other components within normal limits  LIPASE, BLOOD  TROPONIN I   ____________________________________________   EKG  Interpreted by me Normal sinus rhythm rate of 71, normal axis and intervals. Normal QRS and ST segments. Isolated T-wave  inversion in aVL which is nonspecific.  ____________________________________________    RADIOLOGY  CT head unremarkable CT abdomen and pelvis unremarkable  ____________________________________________   PROCEDURES   ____________________________________________   INITIAL IMPRESSION / ASSESSMENT AND PLAN / ED COURSE  Pertinent labs & imaging results that were available during my care of the patient were reviewed by me and considered in my medical decision making (see chart for details).  Patient not in distress, low energy but otherwise well appearing. Labs unremarkable, proceeded with CT scans due to the generalized tenderness and the recent history. These also were effectively unremarkable. The patient is tolerating oral intake and requesting water to drink in the emergency department. Based on the progression of symptoms after brushing her teeth actually suspect that what happened is the patient gagged which stimulated a vagal reflex causing all these symptoms. I have low suspicion for intracranial hemorrhage or stroke, no evidence of meningitis encephalitis or sepsis. Low suspicion for pneumonia or ACS. No other vascular phenomenon evident. She started taking aspirin and Plavix and recently had a TTE on 07/03/2015, so I don't think hospitalizing her for further evaluation of this episode will be beneficial. We'll have her follow up with primary care closely  this week.     ____________________________________________   FINAL CLINICAL IMPRESSION(S) / ED DIAGNOSES  Final diagnoses:  Non-intractable vomiting without nausea, vomiting of unspecified type  Mild dehydration       Portions of this note were generated with dragon dictation software. Dictation errors may occur despite best attempts at proofreading.   Sharman CheekPhillip Salvatore Shear, MD 07/27/15 [phone]

## 2015-07-27 NOTE — Discharge Instructions (Signed)
Dehydration °Dehydration is when you lose more fluids from the body than you take in. Vital organs such as the kidneys, brain, and heart cannot function without a proper amount of fluids and salt. Any loss of fluids from the body can cause dehydration.  °Older adults are at a higher risk of dehydration than younger adults. As we age, our bodies are less able to conserve water and do not respond to temperature changes as well. Also, older adults do not become thirsty as easily or quickly. Because of this, older adults often do not realize they need to increase fluids to avoid dehydration.  °CAUSES  °· Vomiting. °· Diarrhea. °· Excessive sweating. °· Excessive urination. °· Fever. °· Certain medicines, such as blood pressure medicines called diuretics. °· Poorly controlled blood sugars. °SIGNS AND SYMPTOMS  °Mild dehydration: °· Thirst. °· Dry lips. °· Slightly dry mouth. °Moderate dehydration: °· Very dry mouth. °· Sunken eyes. °· Skin does not bounce back quickly when lightly pinched and released. °· Dark urine and decreased urine production. °· Decreased tear production. °· Headache. °Severe dehydration: °· Very dry mouth. °· Extreme thirst. °· Rapid, weak pulse (more than 100 beats per minute at rest). °· Cold hands and feet. °· Not able to sweat in spite of heat. °· Rapid breathing. °· Blue lips. °· Confusion and lethargy. °· Difficulty being awakened. °· Minimal urine production. °· No tears. °DIAGNOSIS  °Your health care provider will diagnose dehydration based on your symptoms and your exam. Blood and urine tests will help confirm the diagnosis. The diagnostic evaluation should also identify the cause of dehydration. °TREATMENT  °Treatment of mild or moderate dehydration can often be done at home by increasing the amount of fluids that you drink. It is best to drink small amounts of fluid more often. Drinking too much at one time can make vomiting worse. Severe dehydration needs to be treated at the hospital.  You may be given IV fluids that contain water and electrolytes. °HOME CARE INSTRUCTIONS  °· Ask your health care provider about specific rehydration instructions. °· Drink enough fluids to keep your urine clear or pale yellow. °· Drink small amounts frequently if you have nausea and vomiting. °· Eat as you normally do. °· Avoid: °¨ Foods or drinks high in sugar. °¨ Carbonated drinks. °¨ Juice. °¨ Extremely hot or cold fluids. °¨ Drinks with caffeine. °¨ Fatty, greasy foods. °¨ Alcohol. °¨ Tobacco. °¨ Overeating. °¨ Gelatin desserts. °· Wash your hands well to avoid spreading bacteria and viruses. °· Only take over-the-counter or prescription medicines for pain, discomfort, or fever as directed by your health care provider. °· Ask your health care provider if you should continue all prescribed and over-the-counter medicines. °· Keep all follow-up appointments with your health care provider. °SEEK MEDICAL CARE IF: °· You have abdominal pain, and it increases or stays in one area (localizes). °· You have a rash, stiff neck, or severe headache. °· You are irritable, sleepy, or difficult to awaken. °· You are weak, dizzy, or extremely thirsty. °· You have a fever. °SEEK IMMEDIATE MEDICAL CARE IF:  °· You are unable to keep fluids down, or you get worse despite treatment. °· You have frequent episodes of vomiting or diarrhea. °· You have blood or green matter (bile) in your vomit. °· You have blood in your stool, or your stool looks black and tarry. °· You have not urinated in 6-8 hours, or you have only urinated a small amount of very dark urine. °·   You faint. °MAKE SURE YOU:  °· Understand these instructions. °· Will watch your condition. °· Will get help right away if you are not doing well or get worse. °  °This information is not intended to replace advice given to you by your health care provider. Make sure you discuss any questions you have with your health care provider. °  °Document Released: 06/17/2003 Document  Revised: 04/01/2013 Document Reviewed: 12/02/2012 °Elsevier Interactive Patient Education ©2016 [employer] ° °Nausea and Vomiting °Nausea means you feel sick to your stomach. Throwing up (vomiting) is a reflex where stomach contents come out of your mouth. °HOME CARE  °· Take medicine as told by your doctor. °· Do not force yourself to eat. However, you do need to drink fluids. °· If you feel like eating, eat a normal diet as told by your doctor. °¨ Eat rice, wheat, potatoes, bread, lean meats, yogurt, fruits, and vegetables. °¨ Avoid high-fat foods. °· Drink enough fluids to keep your pee (urine) clear or pale yellow. °· Ask your doctor how to replace body fluid losses (rehydrate). Signs of body fluid loss (dehydration) include: °¨ Feeling very thirsty. °¨ Dry lips and mouth. °¨ Feeling dizzy. °¨ Dark pee. °¨ Peeing less than normal. °¨ Feeling confused. °¨ Fast breathing or heart rate. °GET HELP RIGHT AWAY IF:  °· You have blood in your throw up. °· You have black or bloody poop (stool). °· You have a bad headache or stiff neck. °· You feel confused. °· You have bad belly (abdominal) pain. °· You have chest pain or trouble breathing. °· You do not pee at least once every 8 hours. °· You have cold, clammy skin. °· You keep throwing up after 24 to 48 hours. °· You have a fever. °MAKE SURE YOU:  °· Understand these instructions. °· Will watch your condition. °· Will get help right away if you are not doing well or get worse. °  °This information is not intended to replace advice given to you by your health care provider. Make sure you discuss any questions you have with your health care provider. °  °Document Released: 09/13/2007 Document Revised: 06/19/2011 Document Reviewed: 08/26/2010 °Elsevier Interactive Patient Education ©2016 [employer] Inc. ° °

## 2015-07-28 DIAGNOSIS — I272 Other secondary pulmonary hypertension: Secondary | ICD-10-CM | POA: Diagnosis not present

## 2015-07-28 DIAGNOSIS — I2510 Atherosclerotic heart disease of native coronary artery without angina pectoris: Secondary | ICD-10-CM | POA: Diagnosis not present

## 2015-07-28 DIAGNOSIS — W19XXXD Unspecified fall, subsequent encounter: Secondary | ICD-10-CM | POA: Diagnosis not present

## 2015-07-28 DIAGNOSIS — I10 Essential (primary) hypertension: Secondary | ICD-10-CM | POA: Diagnosis not present

## 2015-07-28 DIAGNOSIS — E119 Type 2 diabetes mellitus without complications: Secondary | ICD-10-CM | POA: Diagnosis not present

## 2015-07-28 DIAGNOSIS — M8008XD Age-related osteoporosis with current pathological fracture, vertebra(e), subsequent encounter for fracture with routine healing: Secondary | ICD-10-CM | POA: Diagnosis not present

## 2015-07-29 DIAGNOSIS — I10 Essential (primary) hypertension: Secondary | ICD-10-CM | POA: Diagnosis not present

## 2015-07-29 DIAGNOSIS — R4182 Altered mental status, unspecified: Secondary | ICD-10-CM | POA: Diagnosis not present

## 2015-07-29 DIAGNOSIS — E1142 Type 2 diabetes mellitus with diabetic polyneuropathy: Secondary | ICD-10-CM | POA: Diagnosis not present

## 2015-07-29 DIAGNOSIS — N390 Urinary tract infection, site not specified: Secondary | ICD-10-CM | POA: Diagnosis not present

## 2015-07-29 DIAGNOSIS — E860 Dehydration: Secondary | ICD-10-CM | POA: Diagnosis not present

## 2015-07-30 DIAGNOSIS — I10 Essential (primary) hypertension: Secondary | ICD-10-CM | POA: Diagnosis not present

## 2015-07-30 DIAGNOSIS — I272 Other secondary pulmonary hypertension: Secondary | ICD-10-CM | POA: Diagnosis not present

## 2015-07-30 DIAGNOSIS — W19XXXD Unspecified fall, subsequent encounter: Secondary | ICD-10-CM | POA: Diagnosis not present

## 2015-07-30 DIAGNOSIS — I2510 Atherosclerotic heart disease of native coronary artery without angina pectoris: Secondary | ICD-10-CM | POA: Diagnosis not present

## 2015-07-30 DIAGNOSIS — E119 Type 2 diabetes mellitus without complications: Secondary | ICD-10-CM | POA: Diagnosis not present

## 2015-07-30 DIAGNOSIS — M8008XD Age-related osteoporosis with current pathological fracture, vertebra(e), subsequent encounter for fracture with routine healing: Secondary | ICD-10-CM | POA: Diagnosis not present

## 2015-08-01 ENCOUNTER — Emergency Department
Admission: EM | Admit: 2015-08-01 | Discharge: 2015-08-01 | Disposition: A | Discharge status: Home / Self Care | Payer: Medicare Other | Attending: Emergency Medicine | Admitting: Emergency Medicine

## 2015-08-01 ENCOUNTER — Emergency Department: Payer: Medicare Other

## 2015-08-01 ENCOUNTER — Encounter: Payer: Self-pay | Admitting: *Deleted

## 2015-08-01 DIAGNOSIS — Z7984 Long term (current) use of oral hypoglycemic drugs: Secondary | ICD-10-CM | POA: Diagnosis not present

## 2015-08-01 DIAGNOSIS — R4182 Altered mental status, unspecified: Secondary | ICD-10-CM | POA: Diagnosis not present

## 2015-08-01 DIAGNOSIS — M6281 Muscle weakness (generalized): Secondary | ICD-10-CM | POA: Diagnosis not present

## 2015-08-01 DIAGNOSIS — I509 Heart failure, unspecified: Secondary | ICD-10-CM | POA: Insufficient documentation

## 2015-08-01 DIAGNOSIS — R410 Disorientation, unspecified: Secondary | ICD-10-CM | POA: Diagnosis not present

## 2015-08-01 DIAGNOSIS — Z8679 Personal history of other diseases of the circulatory system: Secondary | ICD-10-CM | POA: Insufficient documentation

## 2015-08-01 DIAGNOSIS — Z79899 Other long term (current) drug therapy: Secondary | ICD-10-CM | POA: Insufficient documentation

## 2015-08-01 DIAGNOSIS — I110 Hypertensive heart disease with heart failure: Secondary | ICD-10-CM | POA: Insufficient documentation

## 2015-08-01 DIAGNOSIS — R531 Weakness: Secondary | ICD-10-CM | POA: Diagnosis not present

## 2015-08-01 DIAGNOSIS — M179 Osteoarthritis of knee, unspecified: Secondary | ICD-10-CM | POA: Insufficient documentation

## 2015-08-01 DIAGNOSIS — Z7982 Long term (current) use of aspirin: Secondary | ICD-10-CM | POA: Insufficient documentation

## 2015-08-01 DIAGNOSIS — E119 Type 2 diabetes mellitus without complications: Secondary | ICD-10-CM | POA: Diagnosis not present

## 2015-08-01 DIAGNOSIS — I2510 Atherosclerotic heart disease of native coronary artery without angina pectoris: Secondary | ICD-10-CM | POA: Insufficient documentation

## 2015-08-01 LAB — COMPREHENSIVE METABOLIC PANEL
ALT: 11 U/L — AB (ref 14–54)
AST: 23 U/L (ref 15–41)
Albumin: 3.7 g/dL (ref 3.5–5.0)
Alkaline Phosphatase: 70 U/L (ref 38–126)
Anion gap: 10 (ref 5–15)
BILIRUBIN TOTAL: 0.8 mg/dL (ref 0.3–1.2)
BUN: 6 mg/dL (ref 6–20)
CHLORIDE: 101 mmol/L (ref 101–111)
CO2: 25 mmol/L (ref 22–32)
CREATININE: 0.93 mg/dL (ref 0.44–1.00)
Calcium: 9.7 mg/dL (ref 8.9–10.3)
GFR calc Af Amer: 60 mL/min — ABNORMAL LOW (ref >60)
GFR calc non Af Amer: 52 mL/min — ABNORMAL LOW (ref >60)
GLUCOSE: 113 mg/dL — AB (ref 65–99)
POTASSIUM: 4.7 mmol/L (ref 3.5–5.1)
Sodium: 136 mmol/L (ref 135–145)
TOTAL PROTEIN: 7.4 g/dL (ref 6.5–8.1)

## 2015-08-01 LAB — CBC
HEMATOCRIT: 35.2 % (ref 35.0–47.0)
Hemoglobin: 11.8 g/dL — ABNORMAL LOW (ref 12.0–16.0)
MCH: 29.7 pg (ref 26.0–34.0)
MCHC: 33.5 g/dL (ref 32.0–36.0)
MCV: 88.7 fL (ref 80.0–100.0)
PLATELETS: 257 10*3/uL (ref 150–440)
RBC: 3.96 MIL/uL (ref 3.80–5.20)
RDW: 18.8 % — ABNORMAL HIGH (ref 11.5–14.5)
WBC: 3.9 10*3/uL (ref 3.6–11.0)

## 2015-08-01 LAB — URINALYSIS COMPLETE WITH MICROSCOPIC (ARMC ONLY)
BACTERIA UA: NONE SEEN
Bilirubin Urine: NEGATIVE
Glucose, UA: 50 mg/dL — AB
Hgb urine dipstick: NEGATIVE
LEUKOCYTES UA: NEGATIVE
Nitrite: NEGATIVE
PH: 6 (ref 5.0–8.0)
Protein, ur: NEGATIVE mg/dL
SPECIFIC GRAVITY, URINE: 1.006 (ref 1.005–1.030)
SQUAMOUS EPITHELIAL / LPF: NONE SEEN

## 2015-08-01 LAB — TROPONIN I: Troponin I: 0.03 ng/mL (ref <0.031)

## 2015-08-01 MED ORDER — TRAZODONE HCL 50 MG PO TABS: 50.0000 mg | ORAL_TABLET | Freq: Every day | ORAL | Status: AC

## 2015-08-01 NOTE — ED Notes (Signed)
Pt's family verbalized understanding of discharge instructions. NAD at this time.  

## 2015-08-01 NOTE — ED Provider Notes (Signed)
[facility] Emergency Department Provider Note  Time seen: 3:46 PM  I have reviewed the triage vital signs and the nursing notes.   HISTORY  Chief Complaint Altered Mental Status    HPI Angela Vargas is a 82 y.o. female with a past medical history of diabetes, MI, hypertension, pulmonary hypertension, gastric reflux who presents to the emergency department with altered mental status. According to the family the patient lives at home, home health nurse comes by once or twice a week. At baseline and patient is able to converse normally, ambulates with a walker. They state for the past 4 days the patient has become increasingly weak and is hallucinating seeing people who are not there and conversing with people who were not in the room. Family states this happened approximately one year ago when the patient had a urinary tract infection. They called her primary care physician and told him about this and they called in Bactrim regular strength tablets which she started 2 days ago. Family states the patient has continued to decline and is no longer able to get out of bed for the past 2 days. States she continues to hallucinate as well. Does not appear to be in any discomfort per family. Denies cough. Denies fever.Patient speaks little [place], family occasionally helps translate for patient.    Past Medical History  Diagnosis Date  . Diabetes mellitus without complication (HCC)   . Myocardial infarct (HCC)   . Hypertension   . Compression fracture of T12 vertebra (HCC)   . Pulmonary hypertension (HCC)   . CAD (coronary artery disease)   . Knee osteoarthritis   . GERD (gastroesophageal reflux disease)     Patient Active Problem List   Diagnosis Date Noted  . Protein-calorie malnutrition, severe 07/04/2015  . Chest pain 07/02/2015  . Acute CHF (congestive heart failure) (HCC) 07/02/2015  . GERD (gastroesophageal reflux disease) 07/02/2015  . Type 2 diabetes  mellitus (HCC) 07/02/2015  . HTN (hypertension) 07/02/2015  . CAD (coronary artery disease) 07/02/2015  . Vertebral fracture, osteoporotic (HCC) 06/28/2015    Past Surgical History  Procedure Laterality Date  . Stent placement iliac ([facility] hx)      Current Outpatient Rx  Name  Route  Sig  Dispense  Refill  . acetaminophen (TYLENOL) 500 MG tablet   Oral   Take 1-2 tablets (500-1,000 mg total) by mouth every 6 (six) hours as needed for moderate pain.   30 tablet   0   . aspirin EC 81 MG tablet   Oral   Take 81 mg by mouth daily.         . Calcium Carbonate-Vitamin D (CALCIUM 600+D) 600-200 MG-UNIT TABS   Oral   Take 1 tablet by mouth daily.         . citalopram (CELEXA) 10 MG tablet   Oral   Take 10 mg by mouth at bedtime.       3   . clopidogrel (PLAVIX) 75 MG tablet   Oral   Take 75 mg by mouth daily.       3   . Cranberry 1000 MG CAPS   Oral   Take 1,000 mg by mouth daily.         . cyanocobalamin (,VITAMIN B-12,) 1000 MCG/ML injection   Intramuscular   Inject 1,000 mcg into the muscle every 30 (thirty) days.         . feeding supplement, ENSURE ENLIVE, (ENSURE ENLIVE) LIQD   Oral  Take 237 mLs by mouth 2 (two) times daily between meals.   237 mL   12   . folic acid (FOLVITE) 1 MG tablet   Oral   Take 1 mg by mouth daily.       3   . gabapentin (NEURONTIN) 100 MG capsule   Oral   Take 100 mg by mouth 3 (three) times daily.         Marland Kitchen ibuprofen (MOTRIN IB) 200 MG tablet   Oral   Take 1 tablet (200 mg total) by mouth every 8 (eight) hours as needed for moderate pain or cramping.   30 tablet   0   . isosorbide mononitrate (ISMO,MONOKET) 20 MG tablet   Oral   Take 20 mg by mouth daily.       3   . losartan (COZAAR) 100 MG tablet   Oral   Take 100 mg by mouth daily.       3   . lovastatin (MEVACOR) 20 MG tablet   Oral   Take 20 mg by mouth at bedtime.       3   . metFORMIN (GLUCOPHAGE) 500 MG tablet   Oral   Take 500 mg by  mouth 3 (three) times daily.       3   . metoprolol tartrate (LOPRESSOR) 25 MG tablet   Oral   Take 1 tablet (25 mg total) by mouth 2 (two) times daily.   60 tablet   0   . nitrofurantoin, macrocrystal-monohydrate, (MACROBID) 100 MG capsule   Oral   Take 100 mg by mouth every Monday, Wednesday, and Friday.          Marland Kitchen omeprazole (PRILOSEC) 20 MG capsule   Oral   Take 20 mg by mouth daily.         . ondansetron (ZOFRAN ODT) 4 MG disintegrating tablet   Oral   Take 1 tablet (4 mg total) by mouth every 8 (eight) hours as needed for nausea or vomiting.   20 tablet   0   . pioglitazone (ACTOS) 15 MG tablet   Oral   Take 15 mg by mouth daily.       2     Allergies Sulfa antibiotics  Family History  Problem Relation Age of Onset  . Hypertension Father     Social History Social History  Substance Use Topics  . Smoking status: Never Smoker   . Smokeless tobacco: None  . Alcohol Use: No    Review of Systems Constitutional: Negative for fever. ENT: Negative for congestion Cardiovascular: Negative for chest pain. Respiratory: Negative for shortness of breath.Negative for cough. Gastrointestinal: Negative for abdominal pain, vomiting and diarrhea. Genitourinary: Negative for dysuria. Neurological: Negative for focal weakness or numbness 10-point ROS otherwise negative.  ____________________________________________   PHYSICAL EXAM:  VITAL SIGNS: ED Triage Vitals  Enc Vitals Group     BP 08/01/15 1532 156/75 mmHg     Pulse Rate 08/01/15 1532 72     Resp 08/01/15 1532 18     Temp --      Temp Source 08/01/15 1532 Oral     SpO2 08/01/15 1532 95 %     Weight 08/01/15 1532 127 lb (57.607 kg)     Height 08/01/15 1532 5' (1.524 m)     Head Cir --      Peak Flow --      Pain Score --      Pain Loc --  Pain Edu? --      Excl. in GC? --     Constitutional: Alert. Well appearing and in no distress. Answers questions, follows all commands. Eyes: Normal  exam ENT   Head: Normocephalic and atraumatic   Mouth/Throat: Somewhat Dry mucous membranes. Cardiovascular: Normal rate, regular rhythm.  Respiratory: Normal respiratory effort without tachypnea nor retractions. Breath sounds are clear Gastrointestinal: Soft and nontender. No distention.  Musculoskeletal: Nontender with normal range of motion in all extremities.  Neurologic:  Patient speaks well, some English, otherwise the family translates for her. Appears to move all extremities without obvious deficit. Follows commands appropriately. Answers questions. Skin:  Skin is warm, dry and intact.  Psychiatric: Mood and affect are normal. Speech and behavior are normal.   ____________________________________________   RADIOLOGY  Chest x-ray negative CT head shows no acute abnormality  ____________________________________________    INITIAL IMPRESSION / ASSESSMENT AND PLAN / ED COURSE  Pertinent labs & imaging results that were available during my care of the patient were reviewed by me and considered in my medical decision making (see chart for details).  Patient presents to the emergency department for decreased responsiveness/increased weakness. She is currently being treated with Bactrim for presumed urinary tract infection. Patient's workup in the emergency department is overall very reassuring, labs are within normal limits, urinalysis is negative. Imaging including CT head and chest x-ray are normal. I discussed the results with the family, they wish to take the patient home, stop the antibiotic and see if the patient improves over the next day or 2. I discussed if the patient worsens they need to return to the emergency department, if she is not improving patient also follow-up with her primary care doctor to discuss further home health options or possible nursing home placement if needed/desired. At this time the patient continues to be awake, alert, no distress. I discussed  with him the possibility of a drug related reaction given the symptoms of worsening weakness started when the patient started Bactrim. I also discussed the possibility of dementia causing her symptoms as well. Family states they will follow up with her primary care doctor for further evaluation.  ____________________________________________   FINAL CLINICAL IMPRESSION(S) / ED DIAGNOSES  Generalized weakness   Minna AntisKevin Carrson Lightcap, MD 08/01/15 1724

## 2015-08-01 NOTE — ED Notes (Addendum)
Pt to triage via wheelchair.  Son states pt has been confused for 4 days.  Family states we think she has a uti.  Pt started an septra 2 days ago.  Pt is not any better.   Confusion is worse per son.  No headache.  Pt speaks no english.

## 2015-08-01 NOTE — ED Notes (Signed)
Patient transported to CT 

## 2015-08-01 NOTE — Discharge Instructions (Signed)
As we discussed please stop the patient's antibiotic. Please follow-up with her primary care physician this week for recheck/reevaluation. If you find any time that the patient appears to be worsening/deteriorating please bring her to the emergency department for recheck/reevaluation.   Confusion Confusion is the inability to think with your usual speed or clarity. Confusion may come on quickly or slowly over time. How quickly the confusion comes on depends on the cause. Confusion can be due to any number of causes. CAUSES   Concussion, head injury, or head trauma.  Seizures.  Stroke.  Fever.  Brain tumor.  Age related decreased brain function (dementia).  Heightened emotional states like rage or terror.  Mental illness in which the person loses the ability to determine what is real and what is not (hallucinations).  Infections such as a urinary tract infection (UTI).  Toxic effects from alcohol, drugs, or prescription medicines.  Dehydration and an imbalance of salts in the body (electrolytes).  Lack of sleep.  Low blood sugar (diabetes).  Low levels of oxygen from conditions such as chronic lung disorders.  Drug interactions or other medicine side effects.  Nutritional deficiencies, especially niacin, thiamine, vitamin C, or vitamin B.  Sudden drop in body temperature (hypothermia).  Change in routine, such as when traveling or hospitalized. SIGNS AND SYMPTOMS  People often describe their thinking as cloudy or unclear when they are confused. Confusion can also include feeling disoriented. That means you are unaware of where or who you are. You may also not know what the date or time is. If confused, you may also have difficulty paying attention, remembering, and making decisions. Some people also act aggressively when they are confused.  DIAGNOSIS  The medical evaluation of confusion may include:  Blood and urine tests.  X-rays.  Brain and nervous system  tests.  Analyzing your brain waves (electroencephalogram or EEG).  Magnetic resonance imaging (MRI) of your head.  Computed tomography (CT) scan of your head.  Mental status tests in which your health care provider may ask many questions. Some of these questions may seem silly or strange, but they are a very important test to help diagnose and treat confusion. TREATMENT  An admission to the hospital may not be needed, but a person with confusion should not be left alone. Stay with a family member or friend until the confusion clears. Avoid alcohol, pain relievers, or sedative drugs until you have fully recovered. Do not drive until directed by your health care provider. HOME CARE INSTRUCTIONS  What family and friends can do:  To find out if someone is confused, ask the person to state his or her name, age, and the date. If the person is unsure or answers incorrectly, he or she is confused.  Always introduce yourself, no matter how well the person knows you.  Often remind the person of his or her location.  Place a calendar and clock near the confused person.  Help the person with his or her medicines. You may want to use a pill box, an alarm as a reminder, or give the person each dose as prescribed.  Talk about current events and plans for the day.  Try to keep the environment calm, quiet, and peaceful.  Make sure the person keeps follow-up visits with his or her health care provider. PREVENTION  Ways to prevent confusion:  Avoid alcohol.  Eat a balanced diet.  Get enough sleep.  Take medicine only as directed by your health care provider.  Do not  become isolated. Spend time with other people and make plans for your days.  Keep careful watch on your blood sugar levels if you are diabetic. SEEK IMMEDIATE MEDICAL CARE IF:   You develop severe headaches, repeated vomiting, seizures, blackouts, or slurred speech.  There is increasing confusion, weakness, numbness,  restlessness, or personality changes.  You develop a loss of balance, have marked dizziness, feel uncoordinated, or fall.  You have delusions, hallucinations, or develop severe anxiety.  Your family members think you need to be rechecked.   This information is not intended to replace advice given to you by your health care provider. Make sure you discuss any questions you have with your health care provider.   Document Released: 05/04/2004 Document Revised: 04/17/2014 Document Reviewed: 05/02/2013 Elsevier Interactive Patient Education 2016 Else[employer] Inc.  Weakness Weakness is a lack of strength. You may feel weak all over your body or just in one part of your body. Weakness can be serious. In some cases, you may need more medical tests. HOME CARE  Rest.  Eat a well-balanced diet.  Try to exercise every day.  Only take medicines as told by your doctor. GET HELP RIGHT AWAY IF:   You cannot do your normal daily activities.  You cannot walk up and down stairs, or you feel very tired when you do so.  You have shortness of breath or chest pain.  You have trouble moving parts of your body.  You have weakness in only one body part or on only one side of the body.  You have a fever.  You have trouble speaking or swallowing.  You cannot control when you pee (urinate) or poop (bowel movement).  You have black or bloody throw up (vomit) or poop.  Your weakness gets worse or spreads to other body parts.  You have new aches or pains. MAKE SURE YOU:   Understand these instructions.  Will watch your condition.  Will get help right away if you are not doing well or get worse.   This information is not intended to replace advice given to you by your health care provider. Make sure you discuss any questions you have with your health care provider.   Document Released: 03/09/2008 Document Revised: 09/26/2011 Document Reviewed: 05/26/2011 Elsevier Interactive Patient Education  [employer]

## 2015-08-03 DIAGNOSIS — W19XXXD Unspecified fall, subsequent encounter: Secondary | ICD-10-CM | POA: Diagnosis not present

## 2015-08-03 DIAGNOSIS — E119 Type 2 diabetes mellitus without complications: Secondary | ICD-10-CM | POA: Diagnosis not present

## 2015-08-03 DIAGNOSIS — M8008XD Age-related osteoporosis with current pathological fracture, vertebra(e), subsequent encounter for fracture with routine healing: Secondary | ICD-10-CM | POA: Diagnosis not present

## 2015-08-03 DIAGNOSIS — I2510 Atherosclerotic heart disease of native coronary artery without angina pectoris: Secondary | ICD-10-CM | POA: Diagnosis not present

## 2015-08-03 DIAGNOSIS — I272 Other secondary pulmonary hypertension: Secondary | ICD-10-CM | POA: Diagnosis not present

## 2015-08-03 DIAGNOSIS — I10 Essential (primary) hypertension: Secondary | ICD-10-CM | POA: Diagnosis not present

## 2015-08-03 LAB — URINE CULTURE: Culture: NO GROWTH

## 2015-08-04 DIAGNOSIS — I2510 Atherosclerotic heart disease of native coronary artery without angina pectoris: Secondary | ICD-10-CM | POA: Diagnosis not present

## 2015-08-04 DIAGNOSIS — E119 Type 2 diabetes mellitus without complications: Secondary | ICD-10-CM | POA: Diagnosis not present

## 2015-08-04 DIAGNOSIS — W19XXXD Unspecified fall, subsequent encounter: Secondary | ICD-10-CM | POA: Diagnosis not present

## 2015-08-04 DIAGNOSIS — I272 Other secondary pulmonary hypertension: Secondary | ICD-10-CM | POA: Diagnosis not present

## 2015-08-04 DIAGNOSIS — I10 Essential (primary) hypertension: Secondary | ICD-10-CM | POA: Diagnosis not present

## 2015-08-04 DIAGNOSIS — M8008XD Age-related osteoporosis with current pathological fracture, vertebra(e), subsequent encounter for fracture with routine healing: Secondary | ICD-10-CM | POA: Diagnosis not present

## 2015-08-06 DIAGNOSIS — W19XXXD Unspecified fall, subsequent encounter: Secondary | ICD-10-CM | POA: Diagnosis not present

## 2015-08-06 DIAGNOSIS — I272 Other secondary pulmonary hypertension: Secondary | ICD-10-CM | POA: Diagnosis not present

## 2015-08-06 DIAGNOSIS — I2510 Atherosclerotic heart disease of native coronary artery without angina pectoris: Secondary | ICD-10-CM | POA: Diagnosis not present

## 2015-08-06 DIAGNOSIS — E119 Type 2 diabetes mellitus without complications: Secondary | ICD-10-CM | POA: Diagnosis not present

## 2015-08-06 DIAGNOSIS — M8008XD Age-related osteoporosis with current pathological fracture, vertebra(e), subsequent encounter for fracture with routine healing: Secondary | ICD-10-CM | POA: Diagnosis not present

## 2015-08-06 DIAGNOSIS — I10 Essential (primary) hypertension: Secondary | ICD-10-CM | POA: Diagnosis not present

## 2015-08-09 DIAGNOSIS — I10 Essential (primary) hypertension: Secondary | ICD-10-CM | POA: Diagnosis not present

## 2015-08-09 DIAGNOSIS — I2510 Atherosclerotic heart disease of native coronary artery without angina pectoris: Secondary | ICD-10-CM | POA: Diagnosis not present

## 2015-08-09 DIAGNOSIS — M8008XD Age-related osteoporosis with current pathological fracture, vertebra(e), subsequent encounter for fracture with routine healing: Secondary | ICD-10-CM | POA: Diagnosis not present

## 2015-08-09 DIAGNOSIS — E119 Type 2 diabetes mellitus without complications: Secondary | ICD-10-CM | POA: Diagnosis not present

## 2015-08-09 DIAGNOSIS — W19XXXD Unspecified fall, subsequent encounter: Secondary | ICD-10-CM | POA: Diagnosis not present

## 2015-08-09 DIAGNOSIS — I272 Other secondary pulmonary hypertension: Secondary | ICD-10-CM | POA: Diagnosis not present

## 2015-08-10 DIAGNOSIS — I10 Essential (primary) hypertension: Secondary | ICD-10-CM | POA: Diagnosis not present

## 2015-08-10 DIAGNOSIS — I2510 Atherosclerotic heart disease of native coronary artery without angina pectoris: Secondary | ICD-10-CM | POA: Diagnosis not present

## 2015-08-10 DIAGNOSIS — I272 Other secondary pulmonary hypertension: Secondary | ICD-10-CM | POA: Diagnosis not present

## 2015-08-10 DIAGNOSIS — E119 Type 2 diabetes mellitus without complications: Secondary | ICD-10-CM | POA: Diagnosis not present

## 2015-08-10 DIAGNOSIS — M8008XD Age-related osteoporosis with current pathological fracture, vertebra(e), subsequent encounter for fracture with routine healing: Secondary | ICD-10-CM | POA: Diagnosis not present

## 2015-08-10 DIAGNOSIS — W19XXXD Unspecified fall, subsequent encounter: Secondary | ICD-10-CM | POA: Diagnosis not present

## 2015-08-11 DIAGNOSIS — I272 Other secondary pulmonary hypertension: Secondary | ICD-10-CM | POA: Diagnosis not present

## 2015-08-11 DIAGNOSIS — I2510 Atherosclerotic heart disease of native coronary artery without angina pectoris: Secondary | ICD-10-CM | POA: Diagnosis not present

## 2015-08-11 DIAGNOSIS — I10 Essential (primary) hypertension: Secondary | ICD-10-CM | POA: Diagnosis not present

## 2015-08-11 DIAGNOSIS — M8008XD Age-related osteoporosis with current pathological fracture, vertebra(e), subsequent encounter for fracture with routine healing: Secondary | ICD-10-CM | POA: Diagnosis not present

## 2015-08-11 DIAGNOSIS — E119 Type 2 diabetes mellitus without complications: Secondary | ICD-10-CM | POA: Diagnosis not present

## 2015-08-11 DIAGNOSIS — W19XXXD Unspecified fall, subsequent encounter: Secondary | ICD-10-CM | POA: Diagnosis not present

## 2015-08-13 DIAGNOSIS — I2510 Atherosclerotic heart disease of native coronary artery without angina pectoris: Secondary | ICD-10-CM | POA: Diagnosis not present

## 2015-08-13 DIAGNOSIS — M8008XD Age-related osteoporosis with current pathological fracture, vertebra(e), subsequent encounter for fracture with routine healing: Secondary | ICD-10-CM | POA: Diagnosis not present

## 2015-08-13 DIAGNOSIS — E119 Type 2 diabetes mellitus without complications: Secondary | ICD-10-CM | POA: Diagnosis not present

## 2015-08-13 DIAGNOSIS — W19XXXD Unspecified fall, subsequent encounter: Secondary | ICD-10-CM | POA: Diagnosis not present

## 2015-08-13 DIAGNOSIS — I272 Other secondary pulmonary hypertension: Secondary | ICD-10-CM | POA: Diagnosis not present

## 2015-08-13 DIAGNOSIS — I10 Essential (primary) hypertension: Secondary | ICD-10-CM | POA: Diagnosis not present

## 2015-08-17 DIAGNOSIS — F0390 Unspecified dementia without behavioral disturbance: Secondary | ICD-10-CM | POA: Diagnosis not present

## 2015-08-17 DIAGNOSIS — I10 Essential (primary) hypertension: Secondary | ICD-10-CM | POA: Diagnosis not present

## 2015-08-17 DIAGNOSIS — F4489 Other dissociative and conversion disorders: Secondary | ICD-10-CM | POA: Diagnosis not present

## 2015-08-17 DIAGNOSIS — E119 Type 2 diabetes mellitus without complications: Secondary | ICD-10-CM | POA: Diagnosis not present

## 2015-08-17 DIAGNOSIS — E538 Deficiency of other specified B group vitamins: Secondary | ICD-10-CM | POA: Diagnosis not present

## 2015-08-17 DIAGNOSIS — Z7409 Other reduced mobility: Secondary | ICD-10-CM | POA: Diagnosis not present

## 2015-08-17 DIAGNOSIS — W19XXXD Unspecified fall, subsequent encounter: Secondary | ICD-10-CM | POA: Diagnosis not present

## 2015-08-17 DIAGNOSIS — M8008XD Age-related osteoporosis with current pathological fracture, vertebra(e), subsequent encounter for fracture with routine healing: Secondary | ICD-10-CM | POA: Diagnosis not present

## 2015-08-17 DIAGNOSIS — I2510 Atherosclerotic heart disease of native coronary artery without angina pectoris: Secondary | ICD-10-CM | POA: Diagnosis not present

## 2015-08-17 DIAGNOSIS — I272 Other secondary pulmonary hypertension: Secondary | ICD-10-CM | POA: Diagnosis not present

## 2015-08-17 DIAGNOSIS — D649 Anemia, unspecified: Secondary | ICD-10-CM | POA: Diagnosis not present

## 2015-08-18 DIAGNOSIS — I272 Other secondary pulmonary hypertension: Secondary | ICD-10-CM | POA: Diagnosis not present

## 2015-08-18 DIAGNOSIS — M8008XD Age-related osteoporosis with current pathological fracture, vertebra(e), subsequent encounter for fracture with routine healing: Secondary | ICD-10-CM | POA: Diagnosis not present

## 2015-08-18 DIAGNOSIS — W19XXXD Unspecified fall, subsequent encounter: Secondary | ICD-10-CM | POA: Diagnosis not present

## 2015-08-18 DIAGNOSIS — I10 Essential (primary) hypertension: Secondary | ICD-10-CM | POA: Diagnosis not present

## 2015-08-18 DIAGNOSIS — I2510 Atherosclerotic heart disease of native coronary artery without angina pectoris: Secondary | ICD-10-CM | POA: Diagnosis not present

## 2015-08-18 DIAGNOSIS — E119 Type 2 diabetes mellitus without complications: Secondary | ICD-10-CM | POA: Diagnosis not present

## 2015-08-20 DIAGNOSIS — I10 Essential (primary) hypertension: Secondary | ICD-10-CM | POA: Diagnosis not present

## 2015-08-20 DIAGNOSIS — I272 Other secondary pulmonary hypertension: Secondary | ICD-10-CM | POA: Diagnosis not present

## 2015-08-20 DIAGNOSIS — W19XXXD Unspecified fall, subsequent encounter: Secondary | ICD-10-CM | POA: Diagnosis not present

## 2015-08-20 DIAGNOSIS — I2510 Atherosclerotic heart disease of native coronary artery without angina pectoris: Secondary | ICD-10-CM | POA: Diagnosis not present

## 2015-08-20 DIAGNOSIS — M8008XD Age-related osteoporosis with current pathological fracture, vertebra(e), subsequent encounter for fracture with routine healing: Secondary | ICD-10-CM | POA: Diagnosis not present

## 2015-08-20 DIAGNOSIS — E119 Type 2 diabetes mellitus without complications: Secondary | ICD-10-CM | POA: Diagnosis not present

## 2015-08-23 DIAGNOSIS — I272 Other secondary pulmonary hypertension: Secondary | ICD-10-CM | POA: Diagnosis not present

## 2015-08-23 DIAGNOSIS — W19XXXD Unspecified fall, subsequent encounter: Secondary | ICD-10-CM | POA: Diagnosis not present

## 2015-08-23 DIAGNOSIS — I10 Essential (primary) hypertension: Secondary | ICD-10-CM | POA: Diagnosis not present

## 2015-08-23 DIAGNOSIS — E119 Type 2 diabetes mellitus without complications: Secondary | ICD-10-CM | POA: Diagnosis not present

## 2015-08-23 DIAGNOSIS — I2510 Atherosclerotic heart disease of native coronary artery without angina pectoris: Secondary | ICD-10-CM | POA: Diagnosis not present

## 2015-08-23 DIAGNOSIS — M8008XD Age-related osteoporosis with current pathological fracture, vertebra(e), subsequent encounter for fracture with routine healing: Secondary | ICD-10-CM | POA: Diagnosis not present

## 2015-08-26 DIAGNOSIS — E119 Type 2 diabetes mellitus without complications: Secondary | ICD-10-CM | POA: Diagnosis not present

## 2015-08-26 DIAGNOSIS — M8008XD Age-related osteoporosis with current pathological fracture, vertebra(e), subsequent encounter for fracture with routine healing: Secondary | ICD-10-CM | POA: Diagnosis not present

## 2015-08-26 DIAGNOSIS — I10 Essential (primary) hypertension: Secondary | ICD-10-CM | POA: Diagnosis not present

## 2015-08-26 DIAGNOSIS — I2510 Atherosclerotic heart disease of native coronary artery without angina pectoris: Secondary | ICD-10-CM | POA: Diagnosis not present

## 2015-08-26 DIAGNOSIS — I272 Other secondary pulmonary hypertension: Secondary | ICD-10-CM | POA: Diagnosis not present

## 2015-08-26 DIAGNOSIS — W19XXXD Unspecified fall, subsequent encounter: Secondary | ICD-10-CM | POA: Diagnosis not present

## 2015-08-31 DIAGNOSIS — I2510 Atherosclerotic heart disease of native coronary artery without angina pectoris: Secondary | ICD-10-CM | POA: Diagnosis not present

## 2015-08-31 DIAGNOSIS — E119 Type 2 diabetes mellitus without complications: Secondary | ICD-10-CM | POA: Diagnosis not present

## 2015-08-31 DIAGNOSIS — I10 Essential (primary) hypertension: Secondary | ICD-10-CM | POA: Diagnosis not present

## 2015-09-13 DIAGNOSIS — Z7409 Other reduced mobility: Secondary | ICD-10-CM | POA: Diagnosis not present

## 2015-09-13 DIAGNOSIS — E0590 Thyrotoxicosis, unspecified without thyrotoxic crisis or storm: Secondary | ICD-10-CM | POA: Diagnosis not present

## 2015-09-13 DIAGNOSIS — K5900 Constipation, unspecified: Secondary | ICD-10-CM | POA: Diagnosis not present

## 2015-09-13 DIAGNOSIS — R634 Abnormal weight loss: Secondary | ICD-10-CM | POA: Diagnosis not present

## 2015-09-13 DIAGNOSIS — M25561 Pain in right knee: Secondary | ICD-10-CM | POA: Diagnosis not present

## 2015-09-13 DIAGNOSIS — F0390 Unspecified dementia without behavioral disturbance: Secondary | ICD-10-CM | POA: Diagnosis not present

## 2015-11-03 DIAGNOSIS — R441 Visual hallucinations: Secondary | ICD-10-CM | POA: Diagnosis not present

## 2015-11-03 DIAGNOSIS — R454 Irritability and anger: Secondary | ICD-10-CM | POA: Diagnosis not present

## 2015-11-03 DIAGNOSIS — F0391 Unspecified dementia with behavioral disturbance: Secondary | ICD-10-CM | POA: Diagnosis not present

## 2015-11-12 DIAGNOSIS — R05 Cough: Secondary | ICD-10-CM | POA: Diagnosis not present

## 2015-11-12 DIAGNOSIS — E538 Deficiency of other specified B group vitamins: Secondary | ICD-10-CM | POA: Diagnosis not present

## 2015-11-12 DIAGNOSIS — F0391 Unspecified dementia with behavioral disturbance: Secondary | ICD-10-CM | POA: Diagnosis not present

## 2015-11-12 DIAGNOSIS — E0590 Thyrotoxicosis, unspecified without thyrotoxic crisis or storm: Secondary | ICD-10-CM | POA: Diagnosis not present

## 2016-02-01 DIAGNOSIS — E538 Deficiency of other specified B group vitamins: Secondary | ICD-10-CM | POA: Diagnosis not present

## 2016-02-01 DIAGNOSIS — Z23 Encounter for immunization: Secondary | ICD-10-CM | POA: Diagnosis not present

## 2016-02-01 DIAGNOSIS — F0390 Unspecified dementia without behavioral disturbance: Secondary | ICD-10-CM | POA: Diagnosis not present

## 2016-02-01 DIAGNOSIS — R51 Headache: Secondary | ICD-10-CM | POA: Diagnosis not present

## 2016-03-06 DIAGNOSIS — F0281 Dementia in other diseases classified elsewhere with behavioral disturbance: Secondary | ICD-10-CM | POA: Diagnosis not present

## 2016-03-06 DIAGNOSIS — G301 Alzheimer's disease with late onset: Secondary | ICD-10-CM | POA: Diagnosis not present

## 2016-03-06 DIAGNOSIS — R441 Visual hallucinations: Secondary | ICD-10-CM | POA: Diagnosis not present

## 2016-07-19 DIAGNOSIS — F0281 Dementia in other diseases classified elsewhere with behavioral disturbance: Secondary | ICD-10-CM | POA: Diagnosis not present

## 2016-07-19 DIAGNOSIS — G301 Alzheimer's disease with late onset: Secondary | ICD-10-CM | POA: Diagnosis not present

## 2016-07-19 DIAGNOSIS — R441 Visual hallucinations: Secondary | ICD-10-CM | POA: Diagnosis not present

## 2016-08-10 IMAGING — CT CT HEAD W/O CM
1 of 2 series · 16 of 30 positions shown, 20 images · non-contrast
Comparison: CT 07/27/2015

CLINICAL DATA: Confusion and delirium.

EXAM:
CT HEAD WITHOUT CONTRAST
TECHNIQUE: Contiguous axial images were obtained from the base of the skull
through the vertex without intravenous contrast.

[Series 2: head wo · axial · 0.43mm/px · z∈[-131,-2]mm · 16 of 32 slices shown, 20 images]
[im 2/32  brain]
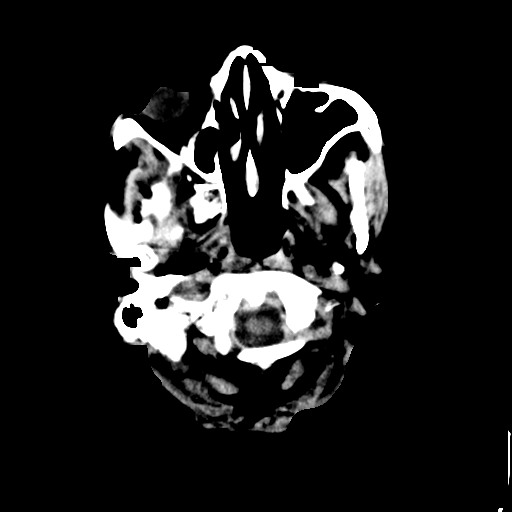
[im 2/32  bone]
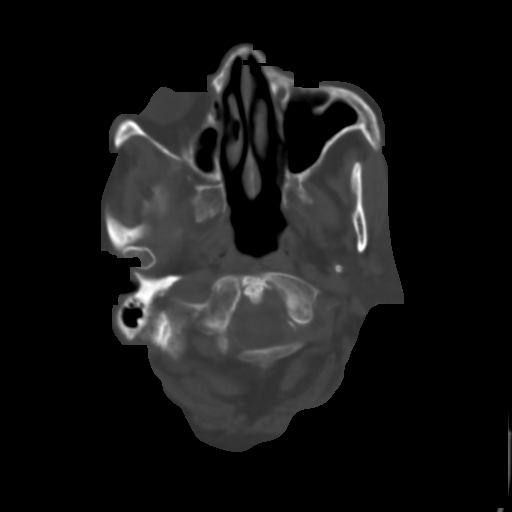
[im 4/32  brain]
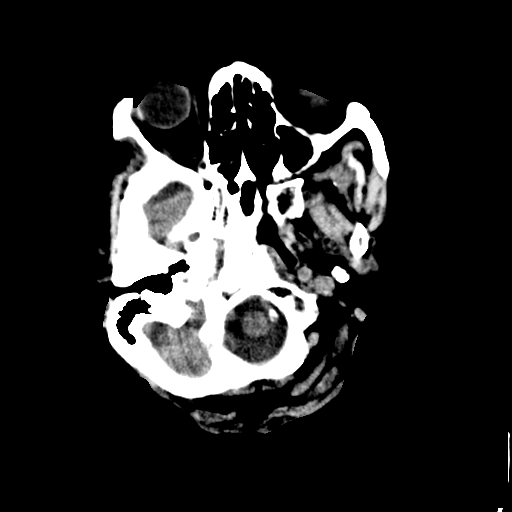
[im 6/32  brain]
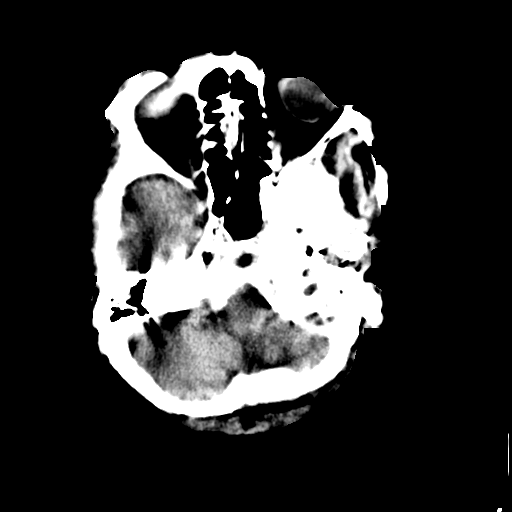
[im 8/32  brain]
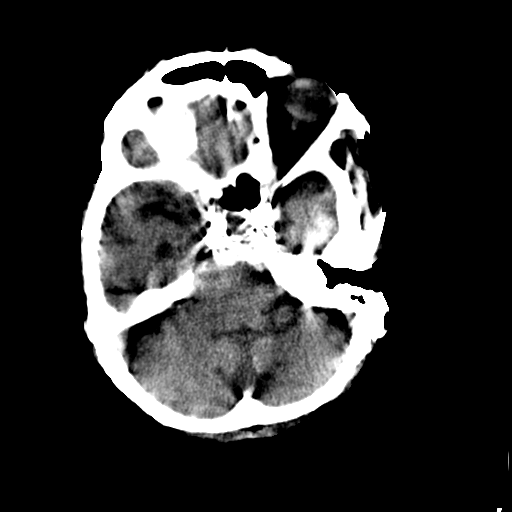
[im 10/32  brain]
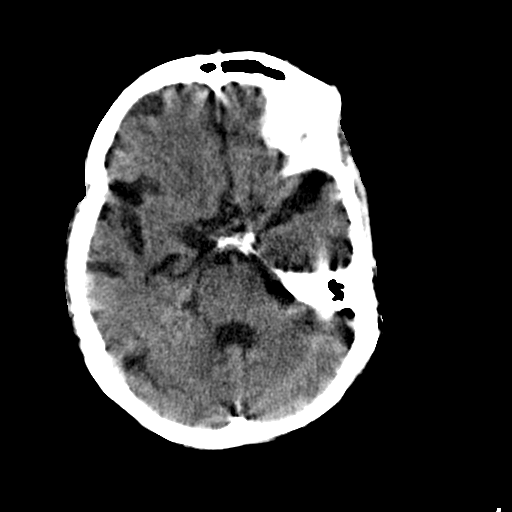
[im 10/32  bone]
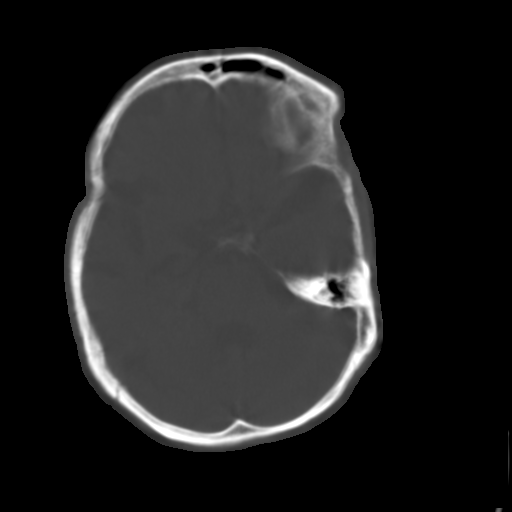
[im 11/32  brain]
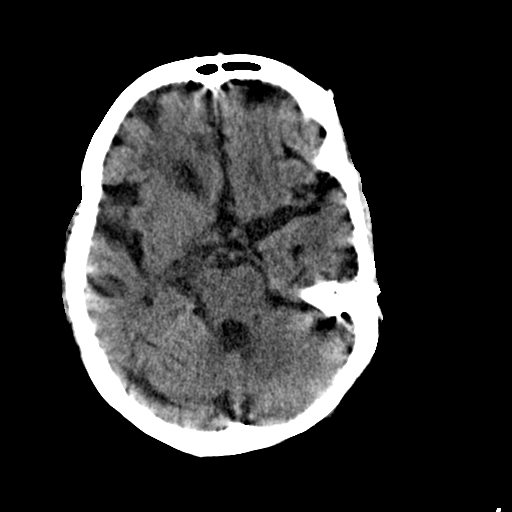
[im 13/32  brain]
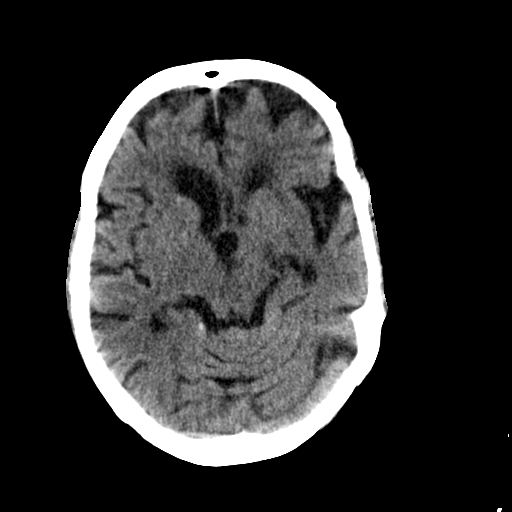
[im 15/32  brain]
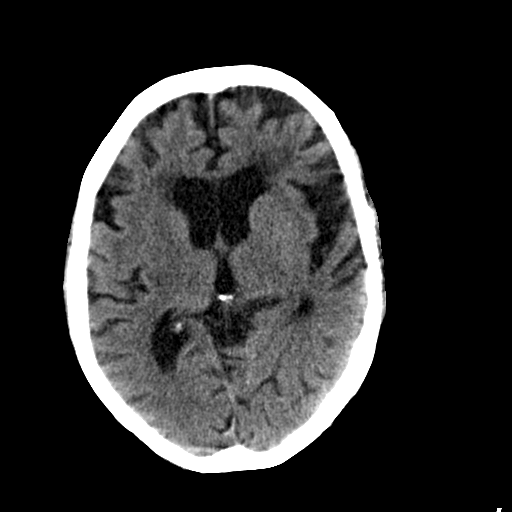
[im 17/32  brain]
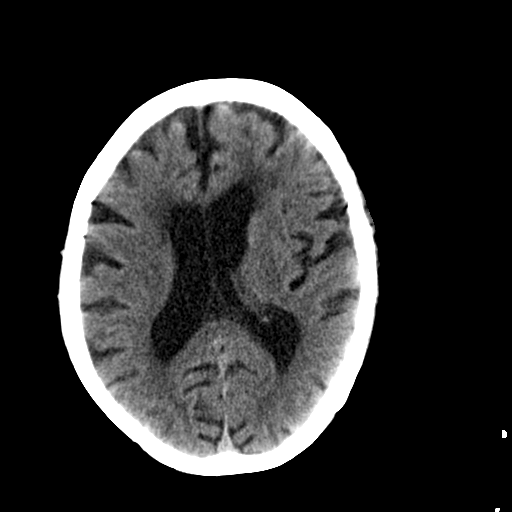
[im 17/32  bone]
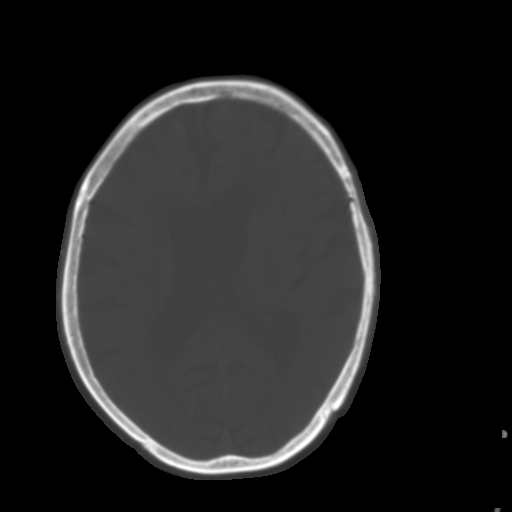
[im 19/32  brain]
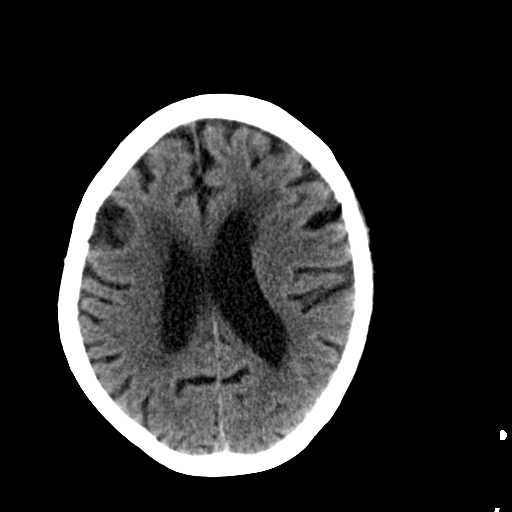
[im 21/32  brain]
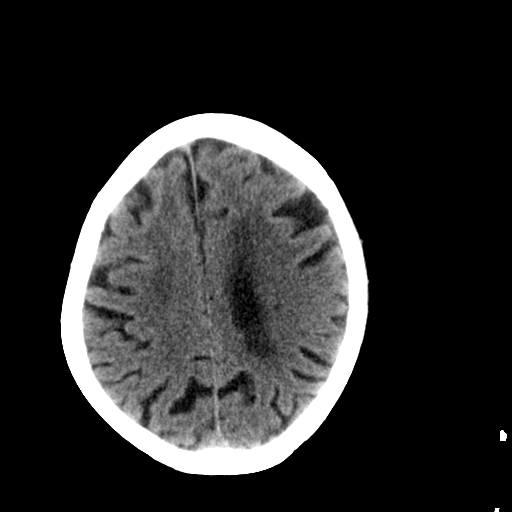
[im 22/32  brain]
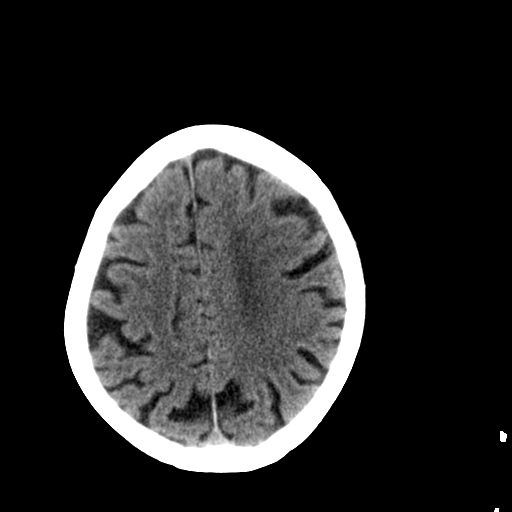
[im 24/32  brain]
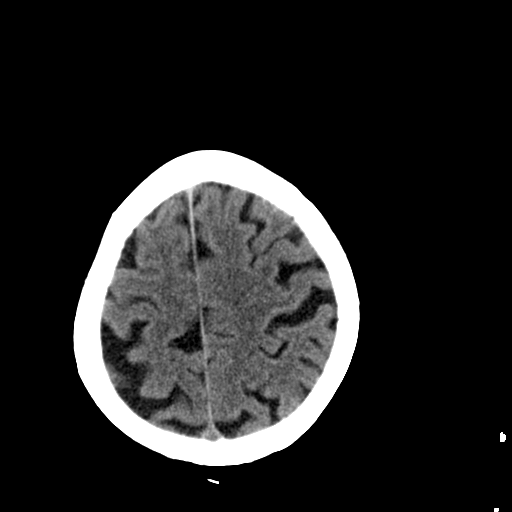
[im 24/32  bone]
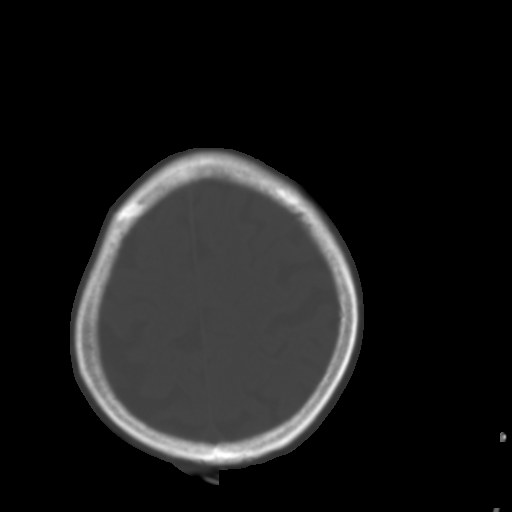
[im 26/32  brain]
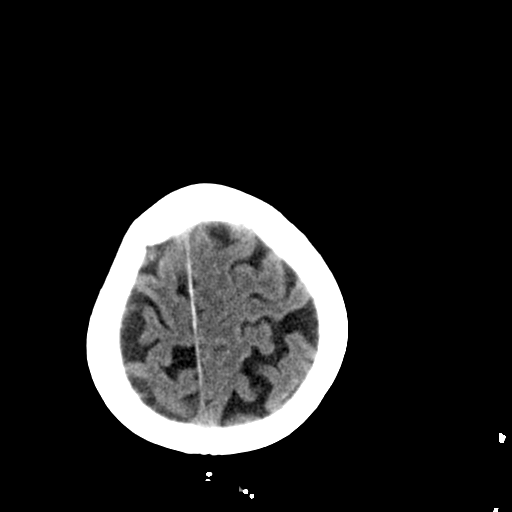
[im 28/32  brain]
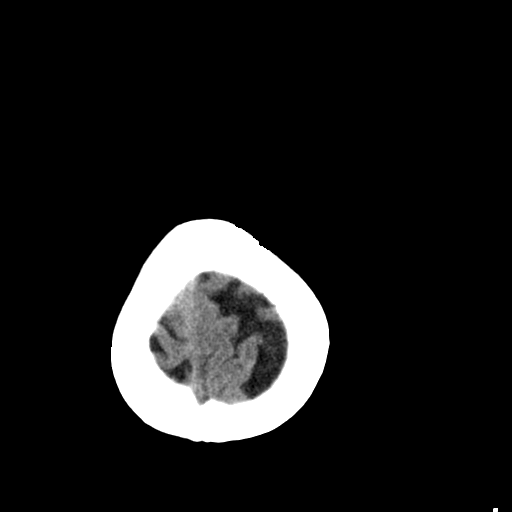
[im 30/32  brain]
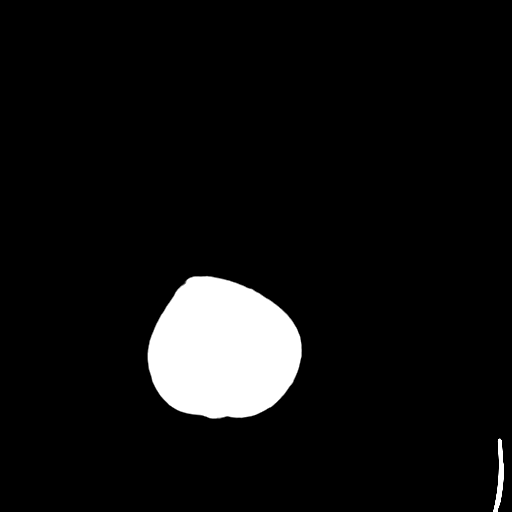

[16 of 30 positions shown; findings below may reference images not displayed]

FINDINGS: No acute intracranial hemorrhage. No focal mass lesion. No CT
evidence of acute infarction. No midline shift or mass effect. No
hydrocephalus. Basilar cisterns are patent.

There are periventricular and subcortical white matter
hypodensities. Generalized cortical atrophy.

Paranasal sinuses and mastoid air cells are clear. Orbits are clear.
IMPRESSION: 1. No acute intracranial findings
2. Atrophy and white matter microvascular disease.

## 2017-01-13 DIAGNOSIS — Z23 Encounter for immunization: Secondary | ICD-10-CM | POA: Diagnosis not present

## 2017-03-14 DIAGNOSIS — Z9181 History of falling: Secondary | ICD-10-CM | POA: Diagnosis not present

## 2017-03-14 DIAGNOSIS — E1140 Type 2 diabetes mellitus with diabetic neuropathy, unspecified: Secondary | ICD-10-CM | POA: Diagnosis not present

## 2017-03-14 DIAGNOSIS — I2510 Atherosclerotic heart disease of native coronary artery without angina pectoris: Secondary | ICD-10-CM | POA: Diagnosis not present

## 2017-03-14 DIAGNOSIS — F0281 Dementia in other diseases classified elsewhere with behavioral disturbance: Secondary | ICD-10-CM | POA: Diagnosis not present

## 2017-03-14 DIAGNOSIS — E538 Deficiency of other specified B group vitamins: Secondary | ICD-10-CM | POA: Diagnosis not present

## 2017-03-14 DIAGNOSIS — Z0000 Encounter for general adult medical examination without abnormal findings: Secondary | ICD-10-CM | POA: Diagnosis not present

## 2017-03-14 DIAGNOSIS — K219 Gastro-esophageal reflux disease without esophagitis: Secondary | ICD-10-CM | POA: Diagnosis not present

## 2017-03-14 DIAGNOSIS — I10 Essential (primary) hypertension: Secondary | ICD-10-CM | POA: Diagnosis not present

## 2017-03-14 DIAGNOSIS — G301 Alzheimer's disease with late onset: Secondary | ICD-10-CM | POA: Diagnosis not present

## 2017-03-31 ENCOUNTER — Emergency Department
Admission: EM | Admit: 2017-03-31 | Discharge: 2017-03-31 | Disposition: A | Discharge status: Home / Self Care | Payer: Medicare Other | Attending: Emergency Medicine | Admitting: Emergency Medicine

## 2017-03-31 ENCOUNTER — Other Ambulatory Visit: Payer: Self-pay

## 2017-03-31 ENCOUNTER — Encounter: Payer: Self-pay | Admitting: Emergency Medicine

## 2017-03-31 ENCOUNTER — Emergency Department: Payer: Medicare Other

## 2017-03-31 DIAGNOSIS — I110 Hypertensive heart disease with heart failure: Secondary | ICD-10-CM | POA: Diagnosis not present

## 2017-03-31 DIAGNOSIS — Z7982 Long term (current) use of aspirin: Secondary | ICD-10-CM | POA: Diagnosis not present

## 2017-03-31 DIAGNOSIS — Z7984 Long term (current) use of oral hypoglycemic drugs: Secondary | ICD-10-CM | POA: Diagnosis not present

## 2017-03-31 DIAGNOSIS — E119 Type 2 diabetes mellitus without complications: Secondary | ICD-10-CM | POA: Insufficient documentation

## 2017-03-31 DIAGNOSIS — W06XXXA Fall from bed, initial encounter: Secondary | ICD-10-CM | POA: Insufficient documentation

## 2017-03-31 DIAGNOSIS — Y999 Unspecified external cause status: Secondary | ICD-10-CM | POA: Diagnosis not present

## 2017-03-31 DIAGNOSIS — I509 Heart failure, unspecified: Secondary | ICD-10-CM | POA: Diagnosis not present

## 2017-03-31 DIAGNOSIS — Y92003 Bedroom of unspecified non-institutional (private) residence as the place of occurrence of the external cause: Secondary | ICD-10-CM | POA: Insufficient documentation

## 2017-03-31 DIAGNOSIS — Z23 Encounter for immunization: Secondary | ICD-10-CM | POA: Diagnosis not present

## 2017-03-31 DIAGNOSIS — Y9384 Activity, sleeping: Secondary | ICD-10-CM | POA: Insufficient documentation

## 2017-03-31 DIAGNOSIS — S01112A Laceration without foreign body of left eyelid and periocular area, initial encounter: Secondary | ICD-10-CM | POA: Diagnosis not present

## 2017-03-31 DIAGNOSIS — S098XXA Other specified injuries of head, initial encounter: Secondary | ICD-10-CM | POA: Diagnosis not present

## 2017-03-31 DIAGNOSIS — S0990XA Unspecified injury of head, initial encounter: Secondary | ICD-10-CM | POA: Insufficient documentation

## 2017-03-31 DIAGNOSIS — S01111A Laceration without foreign body of right eyelid and periocular area, initial encounter: Secondary | ICD-10-CM | POA: Diagnosis not present

## 2017-03-31 DIAGNOSIS — I2510 Atherosclerotic heart disease of native coronary artery without angina pectoris: Secondary | ICD-10-CM | POA: Insufficient documentation

## 2017-03-31 DIAGNOSIS — S199XXA Unspecified injury of neck, initial encounter: Secondary | ICD-10-CM | POA: Diagnosis not present

## 2017-03-31 MED ORDER — TETANUS-DIPHTH-ACELL PERTUSSIS 5-2.5-18.5 LF-MCG/0.5 IM SUSP
0.5000 mL | Freq: Once | INTRAMUSCULAR | Status: AC
  Administered 2017-03-31: 0.5 mL via INTRAMUSCULAR
  Filled 2017-03-31: qty 0.5

## 2017-03-31 MED ORDER — LIDOCAINE-EPINEPHRINE (PF) 1 %-1:200000 IJ SOLN
INTRAMUSCULAR | Status: AC
  Filled 2017-03-31: qty 30

## 2017-03-31 NOTE — Discharge Instructions (Signed)
please return for any further problems listed on the discharge instructions especially for any increasing swelling or pus or redness of the area around the laceration. Clean the wound area with a Kleenex or Q-tips moistened with peroxide one or 2 times a day keep the area covered with some antibiotic ointment and take the stitches out in 5-7 days.

## 2017-03-31 NOTE — ED Provider Notes (Signed)
[facility] Emergency Department Provider Note   ____________________________________________   First MD Initiated Contact with Patient 03/31/17 1108     (approximate)  I have reviewed the triage vital signs and the nursing notes.   HISTORY  Chief Complaint Head Injury    HPI Angela Vargas is a 83 y.o. female Who is brought in by her family. Reportedly she was got up in the middle of the night was sitting on the side of the bed fell asleep and then fell out of the bed forward onto her walker and cut her left eyebrow. She is complaining of little bit of neck pain and has bilateral black eyes. Not sure about loss of consciousness. Not sure about last tetanus shot. Not really hurting much.  Past Medical History:  Diagnosis Date  . CAD (coronary artery disease)   . Compression fracture of T12 vertebra (HCC)   . Diabetes mellitus without complication (HCC)   . GERD (gastroesophageal reflux disease)   . Hypertension   . Knee osteoarthritis   . Myocardial infarct (HCC)   . Pulmonary hypertension [facility])     Patient Active Problem List   Diagnosis Date Noted  . Protein-calorie malnutrition, severe 07/04/2015  . Chest pain 07/02/2015  . Acute CHF (congestive heart failure) (HCC) 07/02/2015  . GERD (gastroesophageal reflux disease) 07/02/2015  . Type 2 diabetes mellitus (HCC) 07/02/2015  . HTN (hypertension) 07/02/2015  . CAD (coronary artery disease) 07/02/2015  . Vertebral fracture, osteoporotic (HCC) 06/28/2015    Past Surgical History:  Procedure Laterality Date  . STENT PLACEMENT ILIAC ([facility] HX)      Prior to Admission medications   Medication Sig Start Date End Date Taking? Authorizing Provider  acetaminophen (TYLENOL) 500 MG tablet Take 1-2 tablets (500-1,000 mg total) by mouth every 6 (six) hours as needed for moderate pain. 06/29/15   Altamese Dilling, MD  aspirin EC 81 MG tablet Take 81 mg by mouth daily.    [provider]    Calcium Carbonate-Vitamin D (CALCIUM 600+D) 600-200 MG-UNIT TABS Take 1 tablet by mouth daily.    [provider]  citalopram (CELEXA) 10 MG tablet Take 10 mg by mouth at bedtime.     [provider]  clopidogrel (PLAVIX) 75 MG tablet Take 75 mg by mouth daily.     [provider]  Cranberry 1000 MG CAPS Take 1,000 mg by mouth daily.    [provider]  cyanocobalamin (,VITAMIN B-12,) 1000 MCG/ML injection Inject 1,000 mcg into the muscle every 30 (thirty) days.    [provider]  feeding supplement, ENSURE ENLIVE, (ENSURE ENLIVE) LIQD Take 237 mLs by mouth 2 (two) times daily between meals. 07/04/15   Hower, Cletis [place], MD  folic acid (FOLVITE) 1 MG tablet Take 1 mg by mouth daily.     [provider]  gabapentin (NEURONTIN) 100 MG capsule Take 100 mg by mouth 3 (three) times daily.    [provider]  ibuprofen (MOTRIN IB) 200 MG tablet Take 1 tablet (200 mg total) by mouth every 8 (eight) hours as needed for moderate pain or cramping. 06/29/15   Altamese Dilling, MD  isosorbide mononitrate (ISMO,MONOKET) 20 MG tablet Take 20 mg by mouth daily.     [provider]  losartan (COZAAR) 100 MG tablet Take 100 mg by mouth daily.     [provider]  lovastatin (MEVACOR) 20 MG tablet Take 20 mg by mouth at bedtime.     [provider]  metFORMIN (GLUCOPHAGE) 500 MG tablet Take 500 mg by mouth 3 (three) times daily.     [provider]  metoprolol tartrate (LOPRESSOR) 25 MG tablet Take 1 tablet (25 mg total) by mouth 2 (two) times daily. 07/04/15   Hower, Cletis Athensavid K, MD  nitrofurantoin, macrocrystal-monohydrate, (MACROBID) 100 MG capsule Take 100 mg by mouth every Monday, Wednesday, and Friday.     [provider]  omeprazole (PRILOSEC) 20 MG capsule Take 20 mg by mouth daily.    [provider]  ondansetron (ZOFRAN ODT) 4 MG disintegrating tablet Take 1 tablet (4 mg total) by mouth every 8  (eight) hours as needed for nausea or vomiting. 06/15/15   Emily FilbertWilliams, Jonathan E, MD  pioglitazone (ACTOS) 15 MG tablet Take 15 mg by mouth daily.     [provider]  traZODone (DESYREL) 50 MG tablet Take 1 tablet (50 mg total) by mouth at bedtime. 08/01/15   Minna AntisPaduchowski, Kevin, MD    Allergies Sulfa antibiotics  Family History  Problem Relation Age of Onset  . Hypertension Father     Social History Social History   Tobacco Use  . Smoking status: Never Smoker  Substance Use Topics  . Alcohol use: No  . Drug use: No    Review of Systems  Constitutional: No fever/chills Eyes: No visual changes. ENT: No sore throat. Cardiovascular: Denies chest pain. Respiratory: Denies shortness of breath. Gastrointestinal: No abdominal pain.  No nausea, no vomiting.  No diarrhea.  No constipation. Genitourinary: Negative for dysuria. Musculoskeletal: Negative for back pain. Skin: Negative for rash. Neurological: Negative for headaches, focal weakness ____________________________________________   PHYSICAL EXAM:  VITAL SIGNS: ED Triage Vitals  Enc Vitals Group     BP 03/31/17 0936 (!) 188/81     Pulse Rate 03/31/17 0936 81     Resp 03/31/17 0936 20     Temp 03/31/17 0936 97.6 F (36.4 C)     Temp Source 03/31/17 0936 Oral     SpO2 03/31/17 0936 95 %     Weight 03/31/17 0937 115 lb (52.2 kg)     Height 03/31/17 0937 5\' 2"  (1.575 m)     Head Circumference --      Peak Flow --      Pain Score 03/31/17 0936 6     Pain Loc --      Pain Edu? --      Excl. in GC? --     Constitutional: Alert and oriented. Well appearing and in no acute distress. Eyes: Conjunctivae are slightly injected. Head: Atraumatic. Nose: No congestion/rhinnorhea. Mouth/Throat: Mucous membranes are moist.  Oropharynx non-erythematous. Neck: No stridor. Cardiovascular: Normal rate, regular rhythm. Grossly normal heart sounds.  Good peripheral circulation. Respiratory: Normal respiratory effort.  No  retractions. Lungs CTAB. Gastrointestinal: Soft and nontender. No distention. No abdominal bruits. No CVA tenderness. extremities no tendernes nor edema.  No joint effusions. Neurologic:  Normal speech and language. No gross focal neurologic deficits are appreciated. . Skin:  Skin is warm, dry and intactexcept for face as noted in history of present illness. No rash noted.   ____________________________________________   LABS (all labs ordered are listed, but only abnormal results are displayed)  Labs Reviewed - No data to display ____________________________________________  EKG   ____________________________________________  RADIOLOGY  Ct Head Wo Contrast  Result Date: 03/31/2017 CLINICAL DATA:  Head laceration after fall. EXAM: CT HEAD WITHOUT CONTRAST CT CERVICAL SPINE WITHOUT CONTRAST TECHNIQUE: Multidetector CT imaging of the head  and cervical spine was performed following the standard protocol without intravenous contrast. Multiplanar CT image reconstructions of the cervical spine were also generated. COMPARISON:  CT scan of August 01, 2015.  CT scan of July 02, 2015. FINDINGS: CT HEAD FINDINGS Brain: Mild diffuse cortical atrophy is noted. Mild chronic ischemic white matter disease is noted. No mass effect or midline shift is noted. Ventricular size is within normal limits. There is no evidence of mass lesion, hemorrhage or acute infarction. Vascular: No hyperdense vessel or unexpected calcification. Skull: Normal. Negative for fracture or focal lesion. Sinuses/Orbits: No acute finding. Other: None. CT CERVICAL SPINE FINDINGS Alignment: Normal. Skull base and vertebrae: No acute fracture. No primary bone lesion or focal pathologic process. Soft tissues and spinal canal: No prevertebral fluid or swelling. No visible canal hematoma. Disc levels: Moderate degenerative disc disease is noted at C5-6. Mild degenerative disc disease is noted at C6-7. Upper chest: Stable left upper lobe  granuloma is noted. No other abnormality is noted. Other: Minimal degenerative changes are seen involving posterior facet joints. IMPRESSION: Mild diffuse cortical atrophy. Mild chronic ischemic white matter disease. No acute intracranial abnormality seen. Multilevel degenerative disc disease is noted in the cervical spine. No acute abnormality is noted. Electronically Signed   By: Lupita Raider, M.D.   On: 03/31/2017 10:22   Ct Cervical Spine Wo Contrast  Result Date: 03/31/2017 CLINICAL DATA:  Head laceration after fall. EXAM: CT HEAD WITHOUT CONTRAST CT CERVICAL SPINE WITHOUT CONTRAST TECHNIQUE: Multidetector CT imaging of the head and cervical spine was performed following the standard protocol without intravenous contrast. Multiplanar CT image reconstructions of the cervical spine were also generated. COMPARISON:  CT scan of August 01, 2015.  CT scan of July 02, 2015. FINDINGS: CT HEAD FINDINGS Brain: Mild diffuse cortical atrophy is noted. Mild chronic ischemic white matter disease is noted. No mass effect or midline shift is noted. Ventricular size is within normal limits. There is no evidence of mass lesion, hemorrhage or acute infarction. Vascular: No hyperdense vessel or unexpected calcification. Skull: Normal. Negative for fracture or focal lesion. Sinuses/Orbits: No acute finding. Other: None. CT CERVICAL SPINE FINDINGS Alignment: Normal. Skull base and vertebrae: No acute fracture. No primary bone lesion or focal pathologic process. Soft tissues and spinal canal: No prevertebral fluid or swelling. No visible canal hematoma. Disc levels: Moderate degenerative disc disease is noted at C5-6. Mild degenerative disc disease is noted at C6-7. Upper chest: Stable left upper lobe granuloma is noted. No other abnormality is noted. Other: Minimal degenerative changes are seen involving posterior facet joints. IMPRESSION: Mild diffuse cortical atrophy. Mild chronic ischemic white matter disease. No acute  intracranial abnormality seen. Multilevel degenerative disc disease is noted in the cervical spine. No acute abnormality is noted. Electronically Signed   By: Lupita Raider, M.D.   On: 03/31/2017 10:22   CT of the head and neck show no acute pathology ____________________________________________   PROCEDURES  Procedure(s) performed: oral consent obtained patient's laceration which was about 1 inch long vertically from the inner corner of the eyebrow was anesthetized with 1% lidocaine with epi and irrigated with copious normal saline and skin was cleaned with Betadine wound was explored no foreign bodies were seen skin was closed with 4 stitches of 4-0 nylon patient tolerated well  Procedures  Critical Care performed:  ____________________________________________   INITIAL IMPRESSION / ASSESSMENT AND PLAN / ED COURSE        ____________________________________________   FINAL CLINICAL IMPRESSION(S) / ED  DIAGNOSES patient does have recommends a CT is negative patient's awake alert eyes themselves note patient's having them are normal looking with little bit of injection of the conjunctiva pupils are equal round reactive extraocular movements are intact  I will discharge the patient with head injury instruction just in case and the wound care instructions. Final diagnoses:  Injury of head, initial encounter  Laceration of left periocular area without foreign body, initial encounter     ED Discharge Orders    None       Note:  This document was prepared using Dragon voice recognition software and may include unintentional dictation errors.    Arnaldo NatalMalinda, Ingeborg Fite F, MD 03/31/17 [phone]

## 2017-03-31 NOTE — ED Notes (Signed)
Correction, patient speaks gujurati

## 2017-03-31 NOTE — ED Triage Notes (Addendum)
States was sitting on side of bed during night, fell asleep and fell off of bed hitting head on walker, States remembers events and does not think she had a loss of consciousness. Laceration L borw and bilateral periorbital ecchymosis.

## 2017-09-24 ENCOUNTER — Inpatient Hospital Stay: Payer: Medicare Other | Attending: Oncology | Admitting: Oncology

## 2017-09-24 ENCOUNTER — Encounter: Payer: Self-pay | Admitting: Oncology

## 2017-09-24 ENCOUNTER — Inpatient Hospital Stay: Payer: Medicare Other

## 2017-09-24 VITALS — BP 156/59 | HR 65 | Temp 98.7°F | Resp 18 | Ht 62.0 in | Wt 115.3 lb

## 2017-09-24 DIAGNOSIS — M25561 Pain in right knee: Secondary | ICD-10-CM

## 2017-09-24 DIAGNOSIS — Z7982 Long term (current) use of aspirin: Secondary | ICD-10-CM | POA: Diagnosis not present

## 2017-09-24 DIAGNOSIS — E785 Hyperlipidemia, unspecified: Secondary | ICD-10-CM

## 2017-09-24 DIAGNOSIS — Z79899 Other long term (current) drug therapy: Secondary | ICD-10-CM | POA: Diagnosis not present

## 2017-09-24 DIAGNOSIS — I509 Heart failure, unspecified: Secondary | ICD-10-CM

## 2017-09-24 DIAGNOSIS — I272 Pulmonary hypertension, unspecified: Secondary | ICD-10-CM | POA: Diagnosis not present

## 2017-09-24 DIAGNOSIS — G8929 Other chronic pain: Secondary | ICD-10-CM

## 2017-09-24 DIAGNOSIS — M25562 Pain in left knee: Secondary | ICD-10-CM

## 2017-09-24 DIAGNOSIS — F0390 Unspecified dementia without behavioral disturbance: Secondary | ICD-10-CM

## 2017-09-24 DIAGNOSIS — I2510 Atherosclerotic heart disease of native coronary artery without angina pectoris: Secondary | ICD-10-CM | POA: Diagnosis not present

## 2017-09-24 DIAGNOSIS — R079 Chest pain, unspecified: Secondary | ICD-10-CM | POA: Diagnosis not present

## 2017-09-24 DIAGNOSIS — I252 Old myocardial infarction: Secondary | ICD-10-CM | POA: Diagnosis not present

## 2017-09-24 DIAGNOSIS — D649 Anemia, unspecified: Secondary | ICD-10-CM

## 2017-09-24 DIAGNOSIS — E43 Unspecified severe protein-calorie malnutrition: Secondary | ICD-10-CM | POA: Diagnosis not present

## 2017-09-24 DIAGNOSIS — I110 Hypertensive heart disease with heart failure: Secondary | ICD-10-CM | POA: Diagnosis not present

## 2017-09-24 DIAGNOSIS — I10 Essential (primary) hypertension: Secondary | ICD-10-CM | POA: Diagnosis not present

## 2017-09-24 DIAGNOSIS — E119 Type 2 diabetes mellitus without complications: Secondary | ICD-10-CM | POA: Diagnosis not present

## 2017-09-24 DIAGNOSIS — Z7984 Long term (current) use of oral hypoglycemic drugs: Secondary | ICD-10-CM | POA: Diagnosis not present

## 2017-09-24 DIAGNOSIS — K219 Gastro-esophageal reflux disease without esophagitis: Secondary | ICD-10-CM

## 2017-09-24 NOTE — Progress Notes (Signed)
Hematology/Oncology Consult note [facility] [phone] [phone]  Patient Care Team: Lyndon CodeKhan, Fozia M, MD as PCP - General (Internal Medicine) Lyndon CodeKhan, Fozia M, MD (Internal Medicine)   Name of the patient: Angela Vargas  [id number]  23-Aug-1921    Reason for referral- anemia   Referring physician- Dr. Ivory BroadGoeres  Date of visit: 09/24/17   History of presenting illness- patient is a 84 yr old female (she is actually 84 years old due to age mix up when she moved from [place] per her son) with PMH significant for HTN, hyperlipidemia and type 2 DM. She also has mild dementia. She lives with her son and his family. She ambulates with a walker. She does not verbalize much today and history obtained with the help of her son. He says she is doing well for her age. Her appetite is good. Bowel movements are regular. She has chronic b/l knee pain. Sleep has been erratic but other than that she has not complained of anything. Recent blood work showed hb of 10.7/34.7. 2 years back in 2017 her hb was the same  ECOG PS- 2  Pain scale- 0   Review of systems- Review of Systems  Unable to perform ROS: Other   Patient does not verbalize during my encounter today   Allergies  Allergen Reactions  . Sulfa Antibiotics     unknown    Patient Active Problem List   Diagnosis Date Noted  . Protein-calorie malnutrition, severe 07/04/2015  . Chest pain 07/02/2015  . Acute CHF (congestive heart failure) (HCC) 07/02/2015  . GERD (gastroesophageal reflux disease) 07/02/2015  . Type 2 diabetes mellitus (HCC) 07/02/2015  . HTN (hypertension) 07/02/2015  . CAD (coronary artery disease) 07/02/2015  . Vertebral fracture, osteoporotic (HCC) 06/28/2015     Past Medical History:  Diagnosis Date  . CAD (coronary artery disease)   . Compression fracture of T12 vertebra (HCC)   . Diabetes mellitus without complication (HCC)   . GERD (gastroesophageal reflux disease)   .  Hypertension   . Knee osteoarthritis   . Myocardial infarct (HCC)   . Pulmonary hypertension (HCC)      Past Surgical History:  Procedure Laterality Date  . STENT PLACEMENT ILIAC ([facility] HX)      Social History   Socioeconomic History  . Marital status: Widowed    Spouse name: Not on file  . Number of children: Not on file  . Years of education: Not on file  . Highest education level: Not on file  Occupational History  . Not on file  Social Needs  . Financial resource strain: Not on file  . Food insecurity:    Worry: Not on file    Inability: Not on file  . Transportation needs:    Medical: Not on file    Non-medical: Not on file  Tobacco Use  . Smoking status: Never Smoker  . Smokeless tobacco: Never Used  Substance and Sexual Activity  . Alcohol use: No  . Drug use: No  . Sexual activity: Not on file  Lifestyle  . Physical activity:    Days per week: Not on file    Minutes per session: Not on file  . Stress: Not on file  Relationships  . Social connections:    Talks on phone: Not on file    Gets together: Not on file    Attends religious service: Not on file    Active member of club or organization: Not on file  Attends meetings of clubs or organizations: Not on file    Relationship status: Not on file  . Intimate partner violence:    Fear of current or ex partner: Not on file    Emotionally abused: Not on file    Physically abused: Not on file    Forced sexual activity: Not on file  Other Topics Concern  . Not on file  Social History Narrative  . Not on file     Family History  Problem Relation Age of Onset  . Hypertension Father      Current Outpatient Medications:  .  aspirin EC 81 MG tablet, Take 81 mg by mouth daily., Disp: , Rfl:  .  Calcium Carbonate-Vitamin D (CALCIUM 600+D) 600-200 MG-UNIT TABS, Take 1 tablet by mouth daily., Disp: , Rfl:  .  citalopram (CELEXA) 10 MG tablet, Take 10 mg by mouth at bedtime. , Disp: , Rfl: 3 .   clopidogrel (PLAVIX) 75 MG tablet, Take 75 mg by mouth daily. , Disp: , Rfl: 3 .  Cranberry 1000 MG CAPS, Take 1,000 mg by mouth daily., Disp: , Rfl:  .  cyanocobalamin (,VITAMIN B-12,) 1000 MCG/ML injection, Inject 1,000 mcg into the muscle every 30 (thirty) days., Disp: , Rfl:  .  folic acid (FOLVITE) 1 MG tablet, Take 1 mg by mouth daily. , Disp: , Rfl: 3 .  gabapentin (NEURONTIN) 100 MG capsule, Take 100 mg by mouth 3 (three) times daily., Disp: , Rfl:  .  isosorbide mononitrate (ISMO,MONOKET) 20 MG tablet, Take 20 mg by mouth daily. , Disp: , Rfl: 3 .  losartan (COZAAR) 100 MG tablet, Take 100 mg by mouth daily. , Disp: , Rfl: 3 .  lovastatin (MEVACOR) 20 MG tablet, Take 20 mg by mouth at bedtime. , Disp: , Rfl: 3 .  metFORMIN (GLUCOPHAGE) 500 MG tablet, Take 500 mg by mouth 3 (three) times daily. , Disp: , Rfl: 3 .  metoprolol tartrate (LOPRESSOR) 25 MG tablet, Take 1 tablet (25 mg total) by mouth 2 (two) times daily., Disp: 60 tablet, Rfl: 0 .  nitrofurantoin, macrocrystal-monohydrate, (MACROBID) 100 MG capsule, Take 100 mg by mouth every Monday, Wednesday, and Friday. , Disp: , Rfl:  .  omeprazole (PRILOSEC) 20 MG capsule, Take 20 mg by mouth daily., Disp: , Rfl:  .  pioglitazone (ACTOS) 15 MG tablet, Take 15 mg by mouth daily. , Disp: , Rfl: 2 .  traZODone (DESYREL) 50 MG tablet, Take 1 tablet (50 mg total) by mouth at bedtime., Disp: 30 tablet, Rfl: 0 .  acetaminophen (TYLENOL) 500 MG tablet, Take 1-2 tablets (500-1,000 mg total) by mouth every 6 (six) hours as needed for moderate pain. (Patient not taking: Reported on 09/24/2017), Disp: 30 tablet, Rfl: 0 .  feeding supplement, ENSURE ENLIVE, (ENSURE ENLIVE) LIQD, Take 237 mLs by mouth 2 (two) times daily between meals. (Patient not taking: Reported on 09/24/2017), Disp: 237 mL, Rfl: 12 .  ibuprofen (MOTRIN IB) 200 MG tablet, Take 1 tablet (200 mg total) by mouth every 8 (eight) hours as needed for moderate pain or cramping. (Patient not  taking: Reported on 09/24/2017), Disp: 30 tablet, Rfl: 0 .  ondansetron (ZOFRAN ODT) 4 MG disintegrating tablet, Take 1 tablet (4 mg total) by mouth every 8 (eight) hours as needed for nausea or vomiting. (Patient not taking: Reported on 09/24/2017), Disp: 20 tablet, Rfl: 0   Physical exam:  Vitals:   09/24/17 1119  BP: (!) 156/59  Pulse: 65  Resp: 18  Temp: 98.7 F (37.1 C)  TempSrc: Tympanic  SpO2: 96%  Weight: 115 lb 4.8 oz (52.3 kg)  Height: 5\' 2"  (1.575 m)   Physical Exam  Constitutional: She appears well-developed and well-nourished.  She is sitting Personal assistant. Does not appear to be in distress  HENT:  Head: Normocephalic and atraumatic.  Eyes: Pupils are equal, round, and reactive to light. EOM are normal.  Neck: Normal range of motion.  Cardiovascular: Normal rate, regular rhythm and normal heart sounds.  Pulmonary/Chest: Effort normal and breath sounds normal.  Abdominal: Soft. Bowel sounds are normal.  Neurological:  Alert and awake but does not verbalize  Skin: Skin is warm and dry.       CMP Latest Ref Rng & Units 08/01/2015  Glucose 65 - 99 mg/dL 161(W)  BUN 6 - 20 mg/dL 6  Creatinine 9.60 - 4.54 mg/dL 0.98  Sodium 119 - 147 mmol/L 136  Potassium 3.5 - 5.1 mmol/L 4.7  Chloride 101 - 111 mmol/L 101  CO2 22 - 32 mmol/L 25  Calcium 8.9 - 10.3 mg/dL 9.7  Total Protein 6.5 - 8.1 g/dL 7.4  Total Bilirubin 0.3 - 1.2 mg/dL 0.8  Alkaline Phos 38 - 126 U/L 70  AST 15 - 41 U/L 23  ALT 14 - 54 U/L 11(L)   CBC Latest Ref Rng & Units 08/01/2015  WBC 3.6 - 11.0 K/uL 3.9  Hemoglobin 12.0 - 16.0 g/dL 11.8(L)  Hematocrit 35.0 - 47.0 % 35.2  Platelets 150 - 440 K/uL 257    Assessment and plan- Patient is a 84 y.o. female referred for normocytic anemia  Over the last 2 years patients hb has remained stable around 10. Recent b12 levels are normal. Patients son does not wish to put the patient through more bloodwork to work up her anemia given her age no change in  symptoms. He wishes to keep doctors visits and interventions t a minimum and does not wish to pursue anemia work up or hematology follow up at this time  Given her age, it may be reasonable to focus on her QOL and symptoms and direct work up if need be.   Thank you for this kind referral and the opportunity to participate in the care of this patient   Visit Diagnosis 1. Normocytic anemia     Dr. Owens Shark, MD, MPH [facility] [phone] 09/24/2017

## 2017-10-18 ENCOUNTER — Encounter: Payer: Self-pay | Admitting: Family Medicine

## 2018-03-20 ENCOUNTER — Inpatient Hospital Stay
Admission: EM | Admit: 2018-03-20 | Discharge: 2018-03-22 | DRG: 065 | Disposition: A | Discharge status: Hospice | Payer: Medicare Other | Attending: Internal Medicine | Admitting: Internal Medicine

## 2018-03-20 ENCOUNTER — Inpatient Hospital Stay (HOSPITAL_COMMUNITY)
Admit: 2018-03-20 | Discharge: 2018-03-20 | Disposition: A | Discharge status: Home / Self Care | Payer: Medicare Other | Attending: Internal Medicine | Admitting: Internal Medicine

## 2018-03-20 ENCOUNTER — Other Ambulatory Visit: Payer: Self-pay

## 2018-03-20 ENCOUNTER — Inpatient Hospital Stay: Payer: Medicare Other

## 2018-03-20 ENCOUNTER — Emergency Department: Payer: Medicare Other

## 2018-03-20 DIAGNOSIS — K219 Gastro-esophageal reflux disease without esophagitis: Secondary | ICD-10-CM | POA: Diagnosis present

## 2018-03-20 DIAGNOSIS — Z955 Presence of coronary angioplasty implant and graft: Secondary | ICD-10-CM

## 2018-03-20 DIAGNOSIS — F0151 Vascular dementia with behavioral disturbance: Secondary | ICD-10-CM | POA: Diagnosis present

## 2018-03-20 DIAGNOSIS — Z7982 Long term (current) use of aspirin: Secondary | ICD-10-CM

## 2018-03-20 DIAGNOSIS — I361 Nonrheumatic tricuspid (valve) insufficiency: Secondary | ICD-10-CM | POA: Diagnosis not present

## 2018-03-20 DIAGNOSIS — R402253 Coma scale, best verbal response, oriented, at hospital admission: Secondary | ICD-10-CM | POA: Diagnosis present

## 2018-03-20 DIAGNOSIS — I10 Essential (primary) hypertension: Secondary | ICD-10-CM | POA: Diagnosis present

## 2018-03-20 DIAGNOSIS — I272 Pulmonary hypertension, unspecified: Secondary | ICD-10-CM | POA: Diagnosis present

## 2018-03-20 DIAGNOSIS — Z7189 Other specified counseling: Secondary | ICD-10-CM

## 2018-03-20 DIAGNOSIS — I2510 Atherosclerotic heart disease of native coronary artery without angina pectoris: Secondary | ICD-10-CM | POA: Diagnosis present

## 2018-03-20 DIAGNOSIS — Z66 Do not resuscitate: Secondary | ICD-10-CM | POA: Diagnosis present

## 2018-03-20 DIAGNOSIS — Z792 Long term (current) use of antibiotics: Secondary | ICD-10-CM

## 2018-03-20 DIAGNOSIS — M1990 Unspecified osteoarthritis, unspecified site: Secondary | ICD-10-CM | POA: Diagnosis present

## 2018-03-20 DIAGNOSIS — E785 Hyperlipidemia, unspecified: Secondary | ICD-10-CM | POA: Diagnosis present

## 2018-03-20 DIAGNOSIS — R29708 NIHSS score 8: Secondary | ICD-10-CM | POA: Diagnosis present

## 2018-03-20 DIAGNOSIS — I639 Cerebral infarction, unspecified: Secondary | ICD-10-CM

## 2018-03-20 DIAGNOSIS — N179 Acute kidney failure, unspecified: Secondary | ICD-10-CM | POA: Diagnosis present

## 2018-03-20 DIAGNOSIS — D649 Anemia, unspecified: Secondary | ICD-10-CM | POA: Diagnosis present

## 2018-03-20 DIAGNOSIS — M1710 Unilateral primary osteoarthritis, unspecified knee: Secondary | ICD-10-CM | POA: Diagnosis present

## 2018-03-20 DIAGNOSIS — E1151 Type 2 diabetes mellitus with diabetic peripheral angiopathy without gangrene: Secondary | ICD-10-CM | POA: Diagnosis present

## 2018-03-20 DIAGNOSIS — Z7902 Long term (current) use of antithrombotics/antiplatelets: Secondary | ICD-10-CM

## 2018-03-20 DIAGNOSIS — S42002A Fracture of unspecified part of left clavicle, initial encounter for closed fracture: Secondary | ICD-10-CM | POA: Diagnosis not present

## 2018-03-20 DIAGNOSIS — S42032A Displaced fracture of lateral end of left clavicle, initial encounter for closed fracture: Secondary | ICD-10-CM | POA: Diagnosis present

## 2018-03-20 DIAGNOSIS — I252 Old myocardial infarction: Secondary | ICD-10-CM

## 2018-03-20 DIAGNOSIS — M4854XA Collapsed vertebra, not elsewhere classified, thoracic region, initial encounter for fracture: Secondary | ICD-10-CM | POA: Diagnosis present

## 2018-03-20 DIAGNOSIS — R296 Repeated falls: Secondary | ICD-10-CM | POA: Diagnosis present

## 2018-03-20 DIAGNOSIS — Y92009 Unspecified place in unspecified non-institutional (private) residence as the place of occurrence of the external cause: Secondary | ICD-10-CM

## 2018-03-20 DIAGNOSIS — R402143 Coma scale, eyes open, spontaneous, at hospital admission: Secondary | ICD-10-CM | POA: Diagnosis present

## 2018-03-20 DIAGNOSIS — Z515 Encounter for palliative care: Secondary | ICD-10-CM

## 2018-03-20 DIAGNOSIS — E871 Hypo-osmolality and hyponatremia: Secondary | ICD-10-CM | POA: Diagnosis present

## 2018-03-20 DIAGNOSIS — G8194 Hemiplegia, unspecified affecting left nondominant side: Secondary | ICD-10-CM | POA: Diagnosis present

## 2018-03-20 DIAGNOSIS — I6529 Occlusion and stenosis of unspecified carotid artery: Secondary | ICD-10-CM | POA: Diagnosis present

## 2018-03-20 DIAGNOSIS — Z882 Allergy status to sulfonamides status: Secondary | ICD-10-CM

## 2018-03-20 DIAGNOSIS — W19XXXA Unspecified fall, initial encounter: Secondary | ICD-10-CM

## 2018-03-20 DIAGNOSIS — R402363 Coma scale, best motor response, obeys commands, at hospital admission: Secondary | ICD-10-CM | POA: Diagnosis present

## 2018-03-20 DIAGNOSIS — Z79899 Other long term (current) drug therapy: Secondary | ICD-10-CM

## 2018-03-20 DIAGNOSIS — G309 Alzheimer's disease, unspecified: Secondary | ICD-10-CM | POA: Diagnosis present

## 2018-03-20 DIAGNOSIS — F01518 Vascular dementia, unspecified severity, with other behavioral disturbance: Secondary | ICD-10-CM

## 2018-03-20 DIAGNOSIS — Z7984 Long term (current) use of oral hypoglycemic drugs: Secondary | ICD-10-CM

## 2018-03-20 DIAGNOSIS — Z95820 Peripheral vascular angioplasty status with implants and grafts: Secondary | ICD-10-CM

## 2018-03-20 LAB — CBC WITH DIFFERENTIAL/PLATELET
Abs Immature Granulocytes: 0.05 10*3/uL (ref 0.00–0.07)
Basophils Absolute: 0.1 10*3/uL (ref 0.0–0.1)
Basophils Relative: 1 %
Eosinophils Absolute: 0.1 10*3/uL (ref 0.0–0.5)
Eosinophils Relative: 1 %
HCT: 37.7 % (ref 36.0–46.0)
Hemoglobin: 12.1 g/dL (ref 12.0–15.0)
Immature Granulocytes: 1 %
LYMPHS PCT: 20 %
Lymphs Abs: 1.3 10*3/uL (ref 0.7–4.0)
MCH: 29.1 pg (ref 26.0–34.0)
MCHC: 32.1 g/dL (ref 30.0–36.0)
MCV: 90.6 fL (ref 80.0–100.0)
MONOS PCT: 11 %
Monocytes Absolute: 0.7 10*3/uL (ref 0.1–1.0)
Neutro Abs: 4.5 10*3/uL (ref 1.7–7.7)
Neutrophils Relative %: 66 %
Platelets: 260 10*3/uL (ref 150–400)
RBC: 4.16 MIL/uL (ref 3.87–5.11)
RDW: 14.1 % (ref 11.5–15.5)
WBC: 6.7 10*3/uL (ref 4.0–10.5)
nRBC: 0 % (ref 0.0–0.2)

## 2018-03-20 LAB — URINALYSIS, COMPLETE (UACMP) WITH MICROSCOPIC
Bacteria, UA: NONE SEEN
Bilirubin Urine: NEGATIVE
Glucose, UA: 50 mg/dL — AB
Hgb urine dipstick: NEGATIVE
Ketones, ur: NEGATIVE mg/dL
Leukocytes, UA: NEGATIVE
Nitrite: NEGATIVE
PH: 6 (ref 5.0–8.0)
Protein, ur: NEGATIVE mg/dL
Specific Gravity, Urine: 1.004 — ABNORMAL LOW (ref 1.005–1.030)

## 2018-03-20 LAB — TROPONIN I: Troponin I: <0.03 ng/mL (ref <0.03)

## 2018-03-20 LAB — COMPREHENSIVE METABOLIC PANEL
ALT: 17 U/L (ref 0–44)
AST: 24 U/L (ref 15–41)
Albumin: 3.8 g/dL (ref 3.5–5.0)
Alkaline Phosphatase: 56 U/L (ref 38–126)
Anion gap: 9 (ref 5–15)
BUN: 30 mg/dL — ABNORMAL HIGH (ref 8–23)
CALCIUM: 8.8 mg/dL — AB (ref 8.9–10.3)
CHLORIDE: 97 mmol/L — AB (ref 98–111)
CO2: 26 mmol/L (ref 22–32)
Creatinine, Ser: 1.46 mg/dL — ABNORMAL HIGH (ref 0.44–1.00)
GFR calc Af Amer: 35 mL/min — ABNORMAL LOW (ref >60)
GFR calc non Af Amer: 30 mL/min — ABNORMAL LOW (ref >60)
Glucose, Bld: 160 mg/dL — ABNORMAL HIGH (ref 70–99)
Potassium: 4.1 mmol/L (ref 3.5–5.1)
Sodium: 132 mmol/L — ABNORMAL LOW (ref 135–145)
Total Bilirubin: 0.8 mg/dL (ref 0.3–1.2)
Total Protein: 7.2 g/dL (ref 6.5–8.1)

## 2018-03-20 LAB — GLUCOSE, CAPILLARY: GLUCOSE-CAPILLARY: 135 mg/dL — AB (ref 70–99)

## 2018-03-20 MED ORDER — ENOXAPARIN SODIUM 40 MG/0.4ML ~~LOC~~ SOLN: 40.0000 mg | SUBCUTANEOUS | Status: DC

## 2018-03-20 MED ORDER — INSULIN ASPART 100 UNIT/ML ~~LOC~~ SOLN
0.0000 [IU] | Freq: Three times a day (TID) | SUBCUTANEOUS | Status: DC
  Administered 2018-03-21: 12:00:00 1 [IU] via SUBCUTANEOUS
  Administered 2018-03-21: 2 [IU] via SUBCUTANEOUS
  Administered 2018-03-22: 1 [IU] via SUBCUTANEOUS
  Filled 2018-03-20 (×3): qty 1

## 2018-03-20 MED ORDER — INSULIN ASPART 100 UNIT/ML ~~LOC~~ SOLN: 0.0000 [IU] | Freq: Every day | SUBCUTANEOUS | Status: DC

## 2018-03-20 MED ORDER — ISOSORBIDE MONONITRATE 20 MG PO TABS
20.0000 mg | ORAL_TABLET | Freq: Every day | ORAL | Status: DC
  Administered 2018-03-21 – 2018-03-22 (×2): 20 mg via ORAL
  Filled 2018-03-20 (×2): qty 1

## 2018-03-20 MED ORDER — CITALOPRAM HYDROBROMIDE 20 MG PO TABS
10.0000 mg | ORAL_TABLET | Freq: Every day | ORAL | Status: DC
  Administered 2018-03-20 – 2018-03-21 (×2): 10 mg via ORAL
  Filled 2018-03-20 (×2): qty 1

## 2018-03-20 MED ORDER — CLOPIDOGREL BISULFATE 75 MG PO TABS
75.0000 mg | ORAL_TABLET | Freq: Every day | ORAL | Status: DC
  Administered 2018-03-20 – 2018-03-22 (×3): 75 mg via ORAL
  Filled 2018-03-20 (×3): qty 1

## 2018-03-20 MED ORDER — ACETAMINOPHEN 160 MG/5ML PO SOLN
650.0000 mg | ORAL | Status: DC | PRN
  Filled 2018-03-20: qty 20.3

## 2018-03-20 MED ORDER — ACETAMINOPHEN 650 MG RE SUPP: 650.0000 mg | RECTAL | Status: DC | PRN

## 2018-03-20 MED ORDER — SENNOSIDES-DOCUSATE SODIUM 8.6-50 MG PO TABS: 1.0000 | ORAL_TABLET | Freq: Every evening | ORAL | Status: DC | PRN

## 2018-03-20 MED ORDER — PANTOPRAZOLE SODIUM 40 MG PO TBEC
40.0000 mg | DELAYED_RELEASE_TABLET | Freq: Every day | ORAL | Status: DC
  Administered 2018-03-21 – 2018-03-22 (×2): 40 mg via ORAL
  Filled 2018-03-20 (×2): qty 1

## 2018-03-20 MED ORDER — SODIUM CHLORIDE 0.9 % IV SOLN
INTRAVENOUS | Status: DC
  Administered 2018-03-20 – 2018-03-21 (×2): via INTRAVENOUS

## 2018-03-20 MED ORDER — ENOXAPARIN SODIUM 30 MG/0.3ML ~~LOC~~ SOLN
30.0000 mg | SUBCUTANEOUS | Status: DC
  Administered 2018-03-20 – 2018-03-21 (×2): 30 mg via SUBCUTANEOUS
  Filled 2018-03-20 (×2): qty 0.3

## 2018-03-20 MED ORDER — GABAPENTIN 100 MG PO CAPS
100.0000 mg | ORAL_CAPSULE | Freq: Three times a day (TID) | ORAL | Status: DC
  Administered 2018-03-20 – 2018-03-22 (×5): 100 mg via ORAL
  Filled 2018-03-20 (×5): qty 1

## 2018-03-20 MED ORDER — POLYETHYLENE GLYCOL 3350 17 G PO PACK
17.0000 g | PACK | Freq: Every day | ORAL | Status: DC
  Administered 2018-03-21 – 2018-03-22 (×2): 17 g via ORAL
  Filled 2018-03-20 (×2): qty 1

## 2018-03-20 MED ORDER — STROKE: EARLY STAGES OF RECOVERY BOOK
Freq: Once | Status: AC
  Administered 2018-03-20: 21:00:00

## 2018-03-20 MED ORDER — NITROFURANTOIN MONOHYD MACRO 100 MG PO CAPS: 100.0000 mg | ORAL_CAPSULE | ORAL | Status: DC

## 2018-03-20 MED ORDER — ACETAMINOPHEN 325 MG PO TABS
650.0000 mg | ORAL_TABLET | ORAL | Status: DC | PRN
  Administered 2018-03-21 – 2018-03-22 (×2): 650 mg via ORAL
  Filled 2018-03-20 (×2): qty 2

## 2018-03-20 MED ORDER — PERFLUTREN LIPID MICROSPHERE
1.0000 mL | INTRAVENOUS | Status: AC | PRN
  Administered 2018-03-20: 2 mL via INTRAVENOUS
  Filled 2018-03-20: qty 10

## 2018-03-20 MED ORDER — ASPIRIN EC 81 MG PO TBEC
81.0000 mg | DELAYED_RELEASE_TABLET | Freq: Every day | ORAL | Status: DC
  Administered 2018-03-21 – 2018-03-22 (×2): 81 mg via ORAL
  Filled 2018-03-20 (×2): qty 1

## 2018-03-20 MED ORDER — METOPROLOL TARTRATE 25 MG PO TABS
12.5000 mg | ORAL_TABLET | Freq: Two times a day (BID) | ORAL | Status: DC
  Administered 2018-03-20 – 2018-03-22 (×4): 12.5 mg via ORAL
  Filled 2018-03-20 (×4): qty 1

## 2018-03-20 MED ORDER — LOSARTAN POTASSIUM 50 MG PO TABS
50.0000 mg | ORAL_TABLET | Freq: Every day | ORAL | Status: DC
  Administered 2018-03-21 – 2018-03-22 (×2): 50 mg via ORAL
  Filled 2018-03-20 (×2): qty 1

## 2018-03-20 NOTE — ED Notes (Signed)
Urine specimen obtained and sent.

## 2018-03-20 NOTE — ED Notes (Signed)
md at bedside to assess and plan admission status.

## 2018-03-20 NOTE — H&P (Addendum)
[facility] [place] [facility]   PATIENT NAME: Angela Vargas    MR#:  [id number]  DATE OF BIRTH:  1921/12/23  DATE OF ADMISSION:  03/20/2018  PRIMARY CARE PHYSICIAN: Patient, No Pcp Per   REQUESTING/REFERRING PHYSICIAN: Dr. Mayford Knife.  CHIEF COMPLAINT:   Chief Complaint  Patient presents with  . Fall  . Shoulder Pain  . Hip Pain   Fall and left-sided weakness. HISTORY OF PRESENT ILLNESS:  Angela Vargas  is a 84 y.o. female with a known history of CAD, hypertension, diabetes, pulmonary hypertension, osteoarthritis dementia and GERD.  The patient is sent from home to ED due to above chief complaints.  The patient is demented and not a good historian.  According to her son, the patient was found left-sided weakness 3 weeks ago.  She has fallen several times and fell last night.  She complains of left shoulder pain and left hip pain.  Is not able to bear weight this morning.  The CAT scan of the head show acute CVA.  The patient has been taking aspirin and the Plavix at home.  But she has not taken aspirin and Plavix today. PAST MEDICAL HISTORY:   Past Medical History:  Diagnosis Date  . CAD (coronary artery disease)   . Compression fracture of T12 vertebra (HCC)   . Diabetes mellitus without complication (HCC)   . GERD (gastroesophageal reflux disease)   . Hypertension   . Knee osteoarthritis   . Myocardial infarct (HCC)   . Pulmonary hypertension (HCC)     PAST SURGICAL HISTORY:   Past Surgical History:  Procedure Laterality Date  . STENT PLACEMENT ILIAC ([facility] HX)      SOCIAL HISTORY:   Social History   Tobacco Use  . Smoking status: Never Smoker  . Smokeless tobacco: Never Used  Substance Use Topics  . Alcohol use: No    FAMILY HISTORY:   Family History  Problem Relation Age of Onset  . Hypertension Father     DRUG ALLERGIES:   Allergies  Allergen Reactions  . Sulfa Antibiotics     unknown    REVIEW OF SYSTEMS:   Review  of Systems  Unable to perform ROS: Dementia    MEDICATIONS AT HOME:   Prior to Admission medications   Medication Sig Start Date End Date Taking? Authorizing Provider  aspirin EC 81 MG tablet Take 81 mg by mouth daily.   Yes [provider]  Calcium Carbonate-Vitamin D (CALCIUM 600+D) 600-200 MG-UNIT TABS Take 1 tablet by mouth daily.   Yes [provider]  Cranberry 1000 MG CAPS Take 1,000 mg by mouth daily.   Yes [provider]  cyanocobalamin (,VITAMIN B-12,) 1000 MCG/ML injection Inject 1,000 mcg into the muscle every 30 (thirty) days.   Yes [provider]  folic acid (FOLVITE) 1 MG tablet Take 1 mg by mouth daily.    Yes [provider]  gabapentin (NEURONTIN) 100 MG capsule Take 100 mg by mouth 3 (three) times daily.   Yes [provider]  isosorbide mononitrate (ISMO,MONOKET) 20 MG tablet Take 20 mg by mouth daily.    Yes [provider]  losartan (COZAAR) 100 MG tablet Take 50 mg by mouth daily.    Yes [provider]  metFORMIN (GLUCOPHAGE) 500 MG tablet Take 500 mg by mouth 3 (three) times daily.    Yes [provider]  metoprolol tartrate (LOPRESSOR) 25 MG tablet Take 1 tablet (25 mg total) by mouth  2 (two) times daily. Patient taking differently: Take 12.5 mg by mouth 2 (two) times daily.  07/04/15  Yes Hower, Cletis Athens, MD  Multiple Vitamins-Minerals (MULTIVITAMIN WITH MINERALS) tablet Take 1 tablet by mouth daily.   Yes [provider]  omeprazole (PRILOSEC) 20 MG capsule Take 20 mg by mouth daily.   Yes [provider]  polyethylene glycol (MIRALAX / GLYCOLAX) packet Take 17 g by mouth daily.   Yes [provider]  acetaminophen (TYLENOL) 500 MG tablet Take 1-2 tablets (500-1,000 mg total) by mouth every 6 (six) hours as needed for moderate pain. Patient not taking: Reported on 09/24/2017 06/29/15   Altamese Dilling, MD  citalopram (CELEXA) 10 MG tablet Take 10 mg by  mouth at bedtime.     [provider]  clopidogrel (PLAVIX) 75 MG tablet Take 75 mg by mouth daily.     [provider]  feeding supplement, ENSURE ENLIVE, (ENSURE ENLIVE) LIQD Take 237 mLs by mouth 2 (two) times daily between meals. Patient not taking: Reported on 09/24/2017 07/04/15   Hower, Cletis [place], MD  ibuprofen (MOTRIN IB) 200 MG tablet Take 1 tablet (200 mg total) by mouth every 8 (eight) hours as needed for moderate pain or cramping. Patient not taking: Reported on 09/24/2017 06/29/15   Altamese Dilling, MD  lovastatin (MEVACOR) 20 MG tablet Take 20 mg by mouth at bedtime.     [provider]  nitrofurantoin, macrocrystal-monohydrate, (MACROBID) 100 MG capsule Take 100 mg by mouth every Monday, Wednesday, and Friday.     [provider]  ondansetron (ZOFRAN ODT) 4 MG disintegrating tablet Take 1 tablet (4 mg total) by mouth every 8 (eight) hours as needed for nausea or vomiting. Patient not taking: Reported on 09/24/2017 06/15/15   Emily Filbert, MD  pioglitazone (ACTOS) 15 MG tablet Take 15 mg by mouth daily.     [provider]  traZODone (DESYREL) 50 MG tablet Take 1 tablet (50 mg total) by mouth at bedtime. Patient not taking: Reported on 03/20/2018 08/01/15   Minna Antis, MD      VITAL SIGNS:  Blood pressure (!) 163/73, pulse 86, temperature 98.6 F (37 C), temperature source Oral, resp. rate 17, SpO2 98 %.  PHYSICAL EXAMINATION:  Physical Exam  GENERAL:  84 y.o.-year-old patient lying in the bed with no acute distress.  EYES: Pupils equal, round, reactive to light and accommodation. No scleral icterus. Extraocular muscles intact.  HEENT: Head atraumatic, normocephalic. Oropharynx and nasopharynx clear.  NECK:  Supple, no jugular venous distention. No thyroid enlargement, no tenderness.  LUNGS: Normal breath sounds bilaterally, no wheezing, rales,rhonchi or crepitation. No use of accessory muscles of respiration.    CARDIOVASCULAR: S1, S2 normal. No murmurs, rubs, or gallops.  ABDOMEN: Soft, nontender, nondistended. Bowel sounds present. No organomegaly or mass.  EXTREMITIES: No pedal edema, cyanosis, or clubbing.  NEUROLOGIC: Cranial nerves II through XII are intact.  Left-sided weakness 2/5. PSYCHIATRIC: The patient is alert and oriented x 1-2.  SKIN: No obvious rash, lesion, or ulcer.   LABORATORY PANEL:   CBC Recent Labs  Lab 03/20/18 1209  WBC 6.7  HGB 12.1  HCT 37.7  PLT 260   ------------------------------------------------------------------------------------------------------------------  Chemistries  Recent Labs  Lab 03/20/18 1209  NA 132*  K 4.1  CL 97*  CO2 26  GLUCOSE 160*  BUN 30*  CREATININE 1.46*  CALCIUM 8.8*  AST 24  ALT 17  ALKPHOS 56  BILITOT 0.8   ------------------------------------------------------------------------------------------------------------------  Cardiac Enzymes  Recent Labs  Lab 03/20/18 1209  TROPONINI <0.03   ------------------------------------------------------------------------------------------------------------------  RADIOLOGY:  Dg Ribs Unilateral W/chest Left  Result Date: 03/20/2018 CLINICAL DATA:  Left-sided rib pain after fall. EXAM: LEFT RIBS AND CHEST - 3+ VIEW COMPARISON:  Chest x-ray dated August 01, 2015. FINDINGS: Stable cardiomediastinal silhouette. Aortoiliac atherosclerotic vascular disease. Normal pulmonary vascularity. No focal consolidation, pleural effusion, or pneumothorax. Unchanged calcified granuloma in the left upper lobe. No rib fracture. Left distal clavicle fracture. IMPRESSION: 1. No rib fracture. Left distal clavicle fracture, better evaluated on left shoulder x-rays. 2.  No active cardiopulmonary disease. Electronically Signed   By: Obie DredgeWilliam T Derry M.D.   On: 03/20/2018 13:08   Ct Head Wo Contrast  Result Date: 03/20/2018 CLINICAL DATA:  Pain following fall. EXAM: CT HEAD WITHOUT CONTRAST TECHNIQUE:  Contiguous axial images were obtained from the base of the skull through the vertex without intravenous contrast. COMPARISON:  March 31, 2017 FINDINGS: Brain: There is moderate diffuse atrophy. There is no intracranial mass, hemorrhage, extra-axial fluid collection, or midline shift. There is a focal area of decreased attenuation in the medial superior right occipital lobe, suspicious for acute infarct focally in this area. There is decreased attenuation involving the mid to lateral right thalamus, also concerning for recent and possibly acute infarct. Elsewhere, there is small vessel disease in the centra semiovale bilaterally which appear stable. Vascular: No hyperdense vessel. There is calcification in the distal left vertebral artery as well as in the carotid siphon regions bilaterally. Skull: The bony calvarium appears intact. Sinuses/Orbits: There is mucosal thickening in several ethmoid air cells. Other visualized paranasal sinuses are clear. Visualized orbits appear symmetric bilaterally. Other: Mastoid air cells are clear. IMPRESSION: 1. Decreased attenuation in the medial superior right occipital lobe, suspicious for acute infarct. This finding is best appreciated on axial slice 16 series 2 and coronal slice 50 series 4. 2. Suspect recent and possibly acute infarct involving portions of the mid to lateral right thalamus. 3.  Atrophy with periventricular small vessel disease. 4.  No evident mass or hemorrhage. 5.  There are foci of arterial vascular calcification. 6.  Mucosal thickening in several ethmoid air cells. 2.  Stable atrophy with periventricular small vessel disease. Electronically Signed   By: Bretta BangWilliam  Woodruff III M.D.   On: 03/20/2018 12:26   Dg Shoulder Left  Result Date: 03/20/2018 CLINICAL DATA:  Fall. EXAM: LEFT SHOULDER - 2+ VIEW COMPARISON:  None. FINDINGS: Mildly displaced, comminuted fracture of the distal clavicle. No dislocation. Glenohumeral and acromioclavicular joint spaces  are preserved. Osteopenia. Soft tissues are unremarkable. IMPRESSION: 1. Comminuted, mildly displaced fracture of the distal clavicle. No dislocation. Electronically Signed   By: Obie DredgeWilliam T Derry M.D.   On: 03/20/2018 13:06   Dg Hip Unilat W Or W/o Pelvis 2-3 Views Left  Result Date: 03/20/2018 CLINICAL DATA:  Left hip pain after fall. EXAM: DG HIP (WITH OR WITHOUT PELVIS) 2-3V LEFT COMPARISON:  CT abdomen pelvis dated July 27, 2015. FINDINGS: No acute fracture or dislocation. The hip joint spaces are relatively preserved. Pubic symphysis and sacroiliac joints are intact. Osteopenia. Amorphous calcification near the left greater trochanter. IMPRESSION: 1.  No acute osseous abnormality. 2. Left gluteal calcific tendinitis. Electronically Signed   By: Obie DredgeWilliam T Derry M.D.   On: 03/20/2018 13:12      IMPRESSION AND PLAN:   Acute CVA. The patient will be admitted to medical floor. Continue aspirin and Plavix, continue statin, echocardiograph and carotid duplex.  Neuro check and neurology consult.  Speech study, PT and OT evaluation.  Acute renal failure and hyponatremia.  Start normal saline IV and follow-up BMP. Left clavicle fracture.  Pain control. Hypertension.  Continue home hypertension medication. Diabetes.  Hold diabetes medication, start sliding scale. CAD.  Continue aspirin, Plavix and statin. Dementia.  Aspiration and fall precaution. The patient has poor prognosis, palliative care consult. All the records are reviewed and case discussed with ED provider. Management plans discussed with the patient's son and daughter-in-law and they are in agreement.  CODE STATUS: DNR.  TOTAL TIME TAKING CARE OF THIS PATIENT: 35 minutes.    Shaune Pollack M.D on 03/20/2018 at 2:33 PM  Between 7am to 6pm - Pager - [phone]  After 6pm go to [email or web address] - Social research officer, government  [facility]  Office  [phone]  CC: Primary care physician; Patient, No Pcp  Per   Note: This dictation was prepared with Dragon dictation along with smaller phrase technology. Any transcriptional errors that result from this process are unin

## 2018-03-20 NOTE — Progress Notes (Signed)
Advanced Care Plan.  Purpose of Encounter: CODE STATUS, palliative care and hospice care. Parties in Attendance: The patient, her son, daughter-in-law and me.  Patient's Decisional Capacity: No. Medical Story: [name]  is a 84 y.o. female with a known history of CAD, hypertension, diabetes, pulmonary hypertension, osteoarthritis dementia and GERD.  She is being admitted for acute CVA with left side weakness.  I discussed with the patient's son about her current condition, poor prognosis, CODE STATUS, palliative care and hospice care.  The patient's son stated he does not want the patient to be resuscitated and intubated if she has cardiopulmonary arrest.  He does not want her suffer.  I discussed with him about palliative care and hospice care.  He expressed understanding and agreed to get palliative care consult. Goals of Care Determinations: Hospice care or palliative care at home. Plan:  Code Status: DNR. Time spent discussing advance care planning: 20 minutes.

## 2018-03-20 NOTE — ED Triage Notes (Signed)
S/p fall last night. Present with c/o of pain to left shoulder / left hip. Unable to bare wt this morning. Patient non english speaking family member at bedside.

## 2018-03-20 NOTE — ED Notes (Signed)
Second attempt to call report. Nurse in patient room unable to take report.

## 2018-03-20 NOTE — ED Provider Notes (Signed)
[facility] Emergency Department Provider Note       Time seen: ----------------------------------------- 12:04 PM on 03/20/2018 -----------------------------------------   I have reviewed the triage vital signs and the nursing notes.  HISTORY   Chief Complaint Fall; Shoulder Pain; and Hip Pain    HPI Angela Vargas is a 84 y.o. female with a history of coronary artery disease, diabetes, GERD, hypertension, MI, pulmonary hypertension who presents to the ED for a fall that occurred last night.  Patient is complaining of pain in her left shoulder and left hip.  She was unable to bear weight this morning.  Family also reports for the last 3 weeks she has had weakness involving the left side of her body but appears to be able to walk at times.  There was a thought that this may be dementia related  Past Medical History:  Diagnosis Date  . CAD (coronary artery disease)   . Compression fracture of T12 vertebra (HCC)   . Diabetes mellitus without complication (HCC)   . GERD (gastroesophageal reflux disease)   . Hypertension   . Knee osteoarthritis   . Myocardial infarct (HCC)   . Pulmonary hypertension [facility](HCC)     Patient Active Problem List   Diagnosis Date Noted  . Protein-calorie malnutrition, severe 07/04/2015  . Chest pain 07/02/2015  . Acute CHF (congestive heart failure) (HCC) 07/02/2015  . GERD (gastroesophageal reflux disease) 07/02/2015  . Type 2 diabetes mellitus (HCC) 07/02/2015  . HTN (hypertension) 07/02/2015  . CAD (coronary artery disease) 07/02/2015  . Vertebral fracture, osteoporotic (HCC) 06/28/2015    Past Surgical History:  Procedure Laterality Date  . STENT PLACEMENT ILIAC ([facility] HX)      Allergies Sulfa antibiotics  Social History Social History   Tobacco Use  . Smoking status: Never Smoker  . Smokeless tobacco: Never Used  Substance Use Topics  . Alcohol use: No  . Drug use: No   Review of Systems Constitutional:  Negative for fever. Cardiovascular: Negative for chest pain. Respiratory: Negative for shortness of breath. Gastrointestinal: Negative for abdominal pain, vomiting and diarrhea. Musculoskeletal: Positive for left shoulder hip and side pain Skin: Negative for rash. Neurological: Positive for left-sided weakness   All systems negative/normal/unremarkable except as stated in the HPI  ____________________________________________   PHYSICAL EXAM:  VITAL SIGNS: ED Triage Vitals  Enc Vitals Group     BP 03/20/18 1159 (!) 171/68     Pulse Rate 03/20/18 1157 88     Resp 03/20/18 1157 17     Temp 03/20/18 1157 98.6 F (37 C)     Temp Source 03/20/18 1157 Oral     SpO2 03/20/18 1157 96 %     Weight --      Height --      Head Circumference --      Peak Flow --      Pain Score 03/20/18 1158 9     Pain Loc --      Pain Edu? --      Excl. in GC? --    Constitutional: Alert, well appearing and in no distress. Eyes: Conjunctivae are normal. Normal extraocular movements. ENT   Head: Normocephalic and atraumatic.   Nose: No congestion/rhinnorhea.   Mouth/Throat: Mucous membranes are moist.   Neck: No stridor. Cardiovascular: Normal rate, regular rhythm. No murmurs, rubs, or gallops. Respiratory: Normal respiratory effort without tachypnea nor retractions. Breath sounds are clear and equal bilaterally. No wheezes/rales/rhonchi. Gastrointestinal: Soft and nontender. Normal bowel sounds Musculoskeletal:  Pain with range of motion and swelling and ecchymosis of the left shoulder Neurologic:  Normal speech and language. No gross focal neurologic deficits are appreciated.  No obvious strength deficits are appreciated.  Difficult to assess sensory deficits. Skin:  Skin is warm, dry and intact. No rash noted. Psychiatric: Mood and affect are normal. Speech and behavior are normal.  ____________________________________________  EKG: Interpreted by me.  Sinus rhythm rate 84 bpm,  normal PR interval, normal QRS, normal QT  ____________________________________________  ED COURSE:  As part of my medical decision making, I reviewed the following data within the electronic MEDICAL RECORD NUMBER History obtained from family if available, nursing notes, old chart and ekg, as well as notes from prior ED visits. Patient presented for fall with left-sided pain and weakness, we will assess with labs and imaging as indicated at this time.   Procedures ____________________________________________   LABS (pertinent positives/negatives)  Labs Reviewed  COMPREHENSIVE METABOLIC PANEL - Abnormal; Notable for the following components:      Result Value   Sodium 132 (*)    Chloride 97 (*)    Glucose, Bld 160 (*)    BUN 30 (*)    Creatinine, Ser 1.46 (*)    Calcium 8.8 (*)    GFR calc non Af Amer 30 (*)    GFR calc Af Amer 35 (*)    All other components within normal limits  CBC WITH DIFFERENTIAL/PLATELET  TROPONIN I  URINALYSIS, COMPLETE (UACMP) WITH MICROSCOPIC  CBG MONITORING, ED    RADIOLOGY Images were viewed by me  CT head, left shoulder x-ray, left rib x-ray, left hip x-ray IMPRESSION: 1. Decreased attenuation in the medial superior right occipital lobe, suspicious for acute infarct. This finding is best appreciated on axial slice 16 series 2 and coronal slice 50 series 4.  2. Suspect recent and possibly acute infarct involving portions of the mid to lateral right thalamus.  3.  Atrophy with periventricular small vessel disease.  4.  No evident mass or hemorrhage.  5.  There are foci of arterial vascular calcification.  6.  Mucosal thickening in several ethmoid air cells.  2.  Stable atrophy with periventricular small vessel disease. IMPRESSION: 1.  No acute osseous abnormality. 2. Left gluteal calcific tendinitis. IMPRESSION: 1. No rib fracture. Left distal clavicle fracture, better evaluated on left shoulder x-rays. 2.  No active  cardiopulmonary disease. IMPRESSION: 1. Comminuted, mildly displaced fracture of the distal clavicle. No dislocation. ____________________________________________  DIFFERENTIAL DIAGNOSIS   Dehydration, CVA, fracture, electrolyte abnormality, occult infection  FINAL ASSESSMENT AND PLAN  Fall, weakness, CVA, clavicle fracture   Plan: The patient had presented for a fall with subacute left-sided weakness. Patient's labs did reveal an elevated BUN and creatinine compared to prior but uncertain of its significance. Patient's imaging was indicative of recent stroke likely resulting in her left-sided weakness.  She already takes aspirin and Plavix, it is unclear if she does require any additional anticoagulants at this point.  I will discuss with the hospitalist for admission.   Ulice Dash, MD   Note: This note was generated in part or whole with voice recognition software. Voice recognition is usually quite accurate but there are transcription errors that can and very often do occur. I apologize for any typographical errors that were not detected and corrected.     Emily Filbert, MD 03/20/18 [phone]

## 2018-03-20 NOTE — ED Notes (Addendum)
3rd attempt to call report. Report given to [employer] RN. Patient to unit.

## 2018-03-20 NOTE — ED Notes (Signed)
As per daughter in ;law who takes care of her, reports s/p fall yesterday patient has been falling a lot lately. Reports usually ambulates with walker today was not able to bare weight and was c/o of pain to left hip and left shoulder. Patient presents with bruising like color discolorations to left upper shoulder, patient was not able to lift arm up. No obvious shortening noted to left leg when compared to right. Patient off unit to xray. Family at bedside. Will monitor./

## 2018-03-20 NOTE — ED Notes (Signed)
Patient awaiting MRI, nurse unable to take report. Will call back to give report. Nuerology at bedside to assess.

## 2018-03-20 NOTE — Consult Note (Addendum)
Referring Physician: Laban Emperor    Chief Complaint: Left side weakness and pain  HPI: Angela Vargas is an 84 y.o. female with past medical history of coronary artery disease status post stenting, probable Alzheimer's disease with behavioral disturbances, diabetes mellitus, hypertension, MI, pulmonary hypertension, hyperlipidemia, and chronic anemia presenting to the ED with complaints of left shoulder and left hip pain and weakness status post fall.  Patient does not speak [place], history provided by daughter in-law who is currently at the bedside.  Per daughter-in-law she has had multiple falls in the past 3 weeks with most recent fall last night with injury to the left shoulder and left hip.  She is currently unable to bear weight on the left which was noted by daughter in-law this morning. Denies associated altered sensorium although demented at baseline, denies speech abnormality, cranial nerve deficit, seizures, focal motor or sensory deficits, diplopia, or vomiting, ipsilateral or contralateral paralysis, numbness or tingling or other focal neurologic deficit.  Work-up in the ED included CT head which showed possible acute infarct in the medial superior right occipital lobe and possible acute infarct involving portions of the mid to lateral right thalamus.  She also had x-ray of the left shoulder and was found to have communicated mildly displaced fracture of the distal clavicle.  X-ray of hip showed no acute fractures, left gluteal calcific tendinitis noted.  Due to abnormal CT head patient was admitted for further stroke work-up and management.  Date last known well: Unable to determine Time last known well: Unable to determine tPA Given: No: outside window period. Onset of symptoms 3 weeks ago.  Past Medical History:  Diagnosis Date  . CAD (coronary artery disease)   . Compression fracture of T12 vertebra (HCC)   . Diabetes mellitus without complication (HCC)   . GERD (gastroesophageal reflux  disease)   . Hypertension   . Knee osteoarthritis   . Myocardial infarct (HCC)   . Pulmonary hypertension (HCC)     Past Surgical History:  Procedure Laterality Date  . STENT PLACEMENT ILIAC ([facility] HX)      Family History  Problem Relation Age of Onset  . Hypertension Father    Social History:  reports that she has never smoked. She has never used smokeless tobacco. She reports that she does not drink alcohol or use drugs.  Allergies:  Allergies  Allergen Reactions  . Sulfa Antibiotics     unknown    Medications: I have reviewed the patient's current medications. Prior to Admission: Prior to Admission medications   Medication Sig Start Date End Date Taking? Authorizing Provider  aspirin EC 81 MG tablet Take 81 mg by mouth daily.   Yes [provider]  Calcium Carbonate-Vitamin D (CALCIUM 600+D) 600-200 MG-UNIT TABS Take 1 tablet by mouth daily.   Yes [provider]  Cranberry 1000 MG CAPS Take 1,000 mg by mouth daily.   Yes [provider]  cyanocobalamin (,VITAMIN B-12,) 1000 MCG/ML injection Inject 1,000 mcg into the muscle every 30 (thirty) days.   Yes [provider]  folic acid (FOLVITE) 1 MG tablet Take 1 mg by mouth daily.    Yes [provider]  gabapentin (NEURONTIN) 100 MG capsule Take 100 mg by mouth 3 (three) times daily.   Yes [provider]  isosorbide mononitrate (ISMO,MONOKET) 20 MG tablet Take 20 mg by mouth daily.    Yes [provider]  losartan (COZAAR) 100 MG tablet Take 50 mg by mouth daily.  Yes [provider]  metFORMIN (GLUCOPHAGE) 500 MG tablet Take 500 mg by mouth 3 (three) times daily.    Yes [provider]  metoprolol tartrate (LOPRESSOR) 25 MG tablet Take 1 tablet (25 mg total) by mouth 2 (two) times daily. Patient taking differently: Take 12.5 mg by mouth 2 (two) times daily.  07/04/15  Yes Hower, Cletis Athensavid K, MD  Multiple Vitamins-Minerals (MULTIVITAMIN WITH  MINERALS) tablet Take 1 tablet by mouth daily.   Yes [provider]  omeprazole (PRILOSEC) 20 MG capsule Take 20 mg by mouth daily.   Yes [provider]  polyethylene glycol (MIRALAX / GLYCOLAX) packet Take 17 g by mouth daily.   Yes [provider]  acetaminophen (TYLENOL) 500 MG tablet Take 1-2 tablets (500-1,000 mg total) by mouth every 6 (six) hours as needed for moderate pain. Patient not taking: Reported on 09/24/2017 06/29/15   Altamese DillingVachhani, Vaibhavkumar, MD  citalopram (CELEXA) 10 MG tablet Take 10 mg by mouth at bedtime.     [provider]  clopidogrel (PLAVIX) 75 MG tablet Take 75 mg by mouth daily.     [provider]  feeding supplement, ENSURE ENLIVE, (ENSURE ENLIVE) LIQD Take 237 mLs by mouth 2 (two) times daily between meals. Patient not taking: Reported on 09/24/2017 07/04/15   Hower, Cletis Athensavid K, MD  ibuprofen (MOTRIN IB) 200 MG tablet Take 1 tablet (200 mg total) by mouth every 8 (eight) hours as needed for moderate pain or cramping. Patient not taking: Reported on 09/24/2017 06/29/15   Altamese DillingVachhani, Vaibhavkumar, MD  lovastatin (MEVACOR) 20 MG tablet Take 20 mg by mouth at bedtime.     [provider]  nitrofurantoin, macrocrystal-monohydrate, (MACROBID) 100 MG capsule Take 100 mg by mouth every Monday, Wednesday, and Friday.     [provider]  ondansetron (ZOFRAN ODT) 4 MG disintegrating tablet Take 1 tablet (4 mg total) by mouth every 8 (eight) hours as needed for nausea or vomiting. Patient not taking: Reported on 09/24/2017 06/15/15   Emily FilbertWilliams, Jonathan E, MD  pioglitazone (ACTOS) 15 MG tablet Take 15 mg by mouth daily.     [provider]  traZODone (DESYREL) 50 MG tablet Take 1 tablet (50 mg total) by mouth at bedtime. Patient not taking: Reported on 03/20/2018 08/01/15   Minna AntisPaduchowski, Kevin, MD     (Not in a hospital admission) Scheduled:  ROS: Unable to obtain from patient due to underlying dementia Physical  Examination: Blood pressure (!) 163/73, pulse 86, temperature 98.6 F (37 C), temperature source Oral, resp. rate 17, SpO2 98 %.   HEENT-  Normocephalic, no lesions, without obvious abnormality.  Normal external eye and conjunctiva.  Normal TM's bilaterally.  Normal auditory canals and external ears. Normal external nose, mucus membranes and septum.  Normal pharynx. Cardiovascular- S1, S2 normal, pulses palpable throughout   Lungs- chest clear, no wheezing, rales, normal symmetric air entry Abdomen- soft, non-tender; bowel sounds normal; no masses,  no organomegaly Extremities- no edema Lymph-no adenopathy palpable Musculoskeletal-no joint tenderness, deformity or swelling Skin-warm and dry, no hyperpigmentation, vitiligo, or suspicious lesions  Neurological Exam   Mental Status: Alert, oriented to person, place and situation. Was able to identify daughter in-law by name and relationship to her.  Speech fluent without evidence of aphasia.  Able to follow 3 step commands with some difficulty. Recall and retention impaired (0/3).  Attention span and concentration seemed appropriate  Cranial Nerves: II: Discs flat bilaterally; Visual fields grossly normal, pupils equal, round, reactive to light  and accommodation III,IV, VI: ptosis not present, extra-ocular motions intact bilaterally V,VII: smile symmetric, facial light touch sensation intact VIII: hearing normal bilaterally IX,X: gag reflex present XI: Shrug shoulder due to injury XII: midline tongue extension Motor: Right : Upper extremity   5/5 without pronator drift         Left:Unable to lift upper extremity of the bed due to injury  Right:   Lower extremity   5/5                                          Left: Able to break gravity 3/5 Tone and bulk:normal tone throughout; no atrophy noted Sensory: Pinprick and light touch intact bilaterally Deep Tendon Reflexes: 2+ and symmetric throughout Plantars: Right: mute                               Left: mute Cerebellar: Unable to perform due to difficulty with comprehension and language barrier Gait: not tested due to safety concerns  Data Reviewed  Laboratory Studies:  Basic Metabolic Panel: Recent Labs  Lab 03/20/18 1209  NA 132*  K 4.1  CL 97*  CO2 26  GLUCOSE 160*  BUN 30*  CREATININE 1.46*  CALCIUM 8.8*    Liver Function Tests: Recent Labs  Lab 03/20/18 1209  AST 24  ALT 17  ALKPHOS 56  BILITOT 0.8  PROT 7.2  ALBUMIN 3.8   No results for input(s): LIPASE, AMYLASE in the last 168 hours. No results for input(s): AMMONIA in the last 168 hours.  CBC: Recent Labs  Lab 03/20/18 1209  WBC 6.7  NEUTROABS 4.5  HGB 12.1  HCT 37.7  MCV 90.6  PLT 260    Cardiac Enzymes: Recent Labs  Lab 03/20/18 1209  TROPONINI <0.03    BNP: Invalid input(s): POCBNP  CBG: No results for input(s): GLUCAP in the last 168 hours.  Microbiology: Results for orders placed or performed during the hospital encounter of 08/01/15  Urine culture     Status: None   Collection Time: 08/01/15  3:51 PM  Result Value Ref Range Status   Specimen Description URINE, CATHETERIZED  Final   Special Requests NONE  Final   Culture NO GROWTH 2 DAYS  Final   Report Status 08/03/2015 FINAL  Final    Coagulation Studies: No results for input(s): LABPROT, INR in the last 72 hours.  Urinalysis:  Recent Labs  Lab 03/20/18 1423  COLORURINE STRAW*  LABSPEC 1.004*  PHURINE 6.0  GLUCOSEU 50*  HGBUR NEGATIVE  BILIRUBINUR NEGATIVE  KETONESUR NEGATIVE  PROTEINUR NEGATIVE  NITRITE NEGATIVE  LEUKOCYTESUR NEGATIVE    Lipid Panel: No results found for: CHOL, TRIG, HDL, CHOLHDL, VLDL, LDLCALC  HgbA1C:  Lab Results  Component Value Date   HGBA1C 6.7 (H) 07/03/2015    Urine Drug Screen:  No results found for: LABOPIA, COCAINSCRNUR, LABBENZ, AMPHETMU, THCU, LABBARB  Alcohol Level: No results for input(s): ETH in the last 168 hours.  Imaging: Dg Ribs Unilateral W/chest  Left  Result Date: 03/20/2018 CLINICAL DATA:  Left-sided rib pain after fall. EXAM: LEFT RIBS AND CHEST - 3+ VIEW COMPARISON:  Chest x-ray dated August 01, 2015. FINDINGS: Stable cardiomediastinal silhouette. Aortoiliac atherosclerotic vascular disease. Normal pulmonary vascularity. No focal consolidation, pleural effusion, or pneumothorax. Unchanged calcified granuloma in the left upper lobe. No rib fracture.  Left distal clavicle fracture. IMPRESSION: 1. No rib fracture. Left distal clavicle fracture, better evaluated on left shoulder x-rays. 2.  No active cardiopulmonary disease. Electronically Signed   By: Obie Dredge M.D.   On: 03/20/2018 13:08   Ct Head Wo Contrast  Result Date: 03/20/2018 CLINICAL DATA:  Pain following fall. EXAM: CT HEAD WITHOUT CONTRAST TECHNIQUE: Contiguous axial images were obtained from the base of the skull through the vertex without intravenous contrast. COMPARISON:  March 31, 2017 FINDINGS: Brain: There is moderate diffuse atrophy. There is no intracranial mass, hemorrhage, extra-axial fluid collection, or midline shift. There is a focal area of decreased attenuation in the medial superior right occipital lobe, suspicious for acute infarct focally in this area. There is decreased attenuation involving the mid to lateral right thalamus, also concerning for recent and possibly acute infarct. Elsewhere, there is small vessel disease in the centra semiovale bilaterally which appear stable. Vascular: No hyperdense vessel. There is calcification in the distal left vertebral artery as well as in the carotid siphon regions bilaterally. Skull: The bony calvarium appears intact. Sinuses/Orbits: There is mucosal thickening in several ethmoid air cells. Other visualized paranasal sinuses are clear. Visualized orbits appear symmetric bilaterally. Other: Mastoid air cells are clear. IMPRESSION: 1. Decreased attenuation in the medial superior right occipital lobe, suspicious for acute  infarct. This finding is best appreciated on axial slice 16 series 2 and coronal slice 50 series 4. 2. Suspect recent and possibly acute infarct involving portions of the mid to lateral right thalamus. 3.  Atrophy with periventricular small vessel disease. 4.  No evident mass or hemorrhage. 5.  There are foci of arterial vascular calcification. 6.  Mucosal thickening in several ethmoid air cells. 2.  Stable atrophy with periventricular small vessel disease. Electronically Signed   By: Bretta Bang III M.D.   On: 03/20/2018 12:26   Dg Shoulder Left  Result Date: 03/20/2018 CLINICAL DATA:  Fall. EXAM: LEFT SHOULDER - 2+ VIEW COMPARISON:  None. FINDINGS: Mildly displaced, comminuted fracture of the distal clavicle. No dislocation. Glenohumeral and acromioclavicular joint spaces are preserved. Osteopenia. Soft tissues are unremarkable. IMPRESSION: 1. Comminuted, mildly displaced fracture of the distal clavicle. No dislocation. Electronically Signed   By: Obie Dredge M.D.   On: 03/20/2018 13:06   Dg Hip Unilat W Or W/o Pelvis 2-3 Views Left  Result Date: 03/20/2018 CLINICAL DATA:  Left hip pain after fall. EXAM: DG HIP (WITH OR WITHOUT PELVIS) 2-3V LEFT COMPARISON:  CT abdomen pelvis dated July 27, 2015. FINDINGS: No acute fracture or dislocation. The hip joint spaces are relatively preserved. Pubic symphysis and sacroiliac joints are intact. Osteopenia. Amorphous calcification near the left greater trochanter. IMPRESSION: 1.  No acute osseous abnormality. 2. Left gluteal calcific tendinitis. Electronically Signed   By: Obie Dredge M.D.   On: 03/20/2018 13:12   Patient seen and examined.  Clinical course and management discussed.  Necessary edits performed.  I agree with the above.  Assessment and plan of care developed and discussed below.   Assessment: 84 y.o. female with past medical history of coronary artery disease status post stenting, probable Alzheimer's disease with behavioral  disturbances, diabetes mellitus, hypertension, MI, pulmonary hypertension, hyperlipidemia, and chronic anemia presenting to the ED with complaints of left shoulder and left hip pain and weakness status post fall. CT head showed possible acute infarct in the medial superior right occipital lobe and possible acute infarct involving portions of the mid to lateral right thalamus.  Further imaging necessary  to determine acuity.  She also had x-ray of the left shoulder and was found to have communicated mildly displaced fracture of the distal clavicle.  X-ray of hip showed no acute fractures, left gluteal calcific tendinitis noted.  Patient was taking Aspirin 81 mg and Plavix 75 mg once a day prior to this event  Stroke Risk Factors - carotid stenosis, diabetes mellitus, family history, hyperlipidemia and hypertension  Plan: 1. HgbA1c, fasting lipid panel 2. MRI of the brain without contrast 3. PT consult, OT consult, Speech consult 4. Echocardiogram 5. Carotid dopplers 6. Continue medical management with dual therapy Aspirin 81 mg/day and Plavix 75 mg /day  7. NPO until RN stroke swallow screen 8. Telemetry monitoring 9. Frequent neuro checks  This patient was staffed with Dr. Verlon Au, Thad Ranger who personally evaluated patient, reviewed documentation and agreed with assessment and plan of care as above.  Webb Silversmith, DNP, FNP-BC Board certified Nurse Practitioner Neurology Department  03/20/2018, 2:49 PM  Thana Farr, MD Neurology [phone]  03/20/2018  9:27 PM

## 2018-03-21 ENCOUNTER — Inpatient Hospital Stay: Payer: Medicare Other

## 2018-03-21 DIAGNOSIS — Z7189 Other specified counseling: Secondary | ICD-10-CM

## 2018-03-21 DIAGNOSIS — F0151 Vascular dementia with behavioral disturbance: Secondary | ICD-10-CM

## 2018-03-21 DIAGNOSIS — S42002A Fracture of unspecified part of left clavicle, initial encounter for closed fracture: Secondary | ICD-10-CM

## 2018-03-21 DIAGNOSIS — Z515 Encounter for palliative care: Secondary | ICD-10-CM

## 2018-03-21 DIAGNOSIS — F01518 Vascular dementia, unspecified severity, with other behavioral disturbance: Secondary | ICD-10-CM

## 2018-03-21 LAB — BASIC METABOLIC PANEL
Anion gap: 12 (ref 5–15)
BUN: 20 mg/dL (ref 8–23)
CHLORIDE: 103 mmol/L (ref 98–111)
CO2: 22 mmol/L (ref 22–32)
Calcium: 8.7 mg/dL — ABNORMAL LOW (ref 8.9–10.3)
Creatinine, Ser: 0.96 mg/dL (ref 0.44–1.00)
GFR calc Af Amer: 59 mL/min — ABNORMAL LOW (ref >60)
GFR calc non Af Amer: 51 mL/min — ABNORMAL LOW (ref >60)
GLUCOSE: 132 mg/dL — AB (ref 70–99)
Potassium: 4 mmol/L (ref 3.5–5.1)
Sodium: 137 mmol/L (ref 135–145)

## 2018-03-21 LAB — GLUCOSE, CAPILLARY
GLUCOSE-CAPILLARY: 112 mg/dL — AB (ref 70–99)
Glucose-Capillary: 142 mg/dL — ABNORMAL HIGH (ref 70–99)
Glucose-Capillary: 198 mg/dL — ABNORMAL HIGH (ref 70–99)
Glucose-Capillary: 131 mg/dL — ABNORMAL HIGH (ref 70–99)

## 2018-03-21 LAB — ECHOCARDIOGRAM COMPLETE

## 2018-03-21 NOTE — Care Management Note (Signed)
Case Management Note  Patient Details  Name: Angela Vargas B Mcclees MRN: [id number] Date of Birth: [birth year]  Subjective/Objective:   Ad[facility] with the diagnosis of Acute CVA. Lives with family. Son is Hollice EspyGaman Dalto [phone]). Primary Care physician has been Dr, Welton FlakesKhan in the past,.  Home Health with [facility] in the past. No skilled Nursing Facility. No home oxygen. Rolling walker, cane, wheelchair, hospital bed, and bedside commode in the home.  Family is with Ms. Katen 24/7. Larey SeatFell about 3 weeks ago.  Palliative Care consult.                 Action/Plan: Would like to go home with Hospice services in place.  Spoke to son about available agencies.  Would like [facility]. "They were with my father in 2015." Will give referral to Renea EeKara Marshall, RN representative for [facility]   Expected Discharge Date:                  Expected Discharge Plan:     In-House Referral:   yes  Discharge planning Services   yes  Post Acute Care Choice:   yes Choice offered to:   son  DME Arranged:    DME Agency:     HH Arranged:   yes HH Agency:   [facility]  Status of Service:     If discussed at [employer] Meetings, dates discussed:    Additional Comments:  Gwenette GreetBrenda S Labradford Schnitker, RN MSN CCM Care Management [phone] 03/21/2018, 2:32 PM

## 2018-03-21 NOTE — Plan of Care (Signed)
  Problem: Education: Goal: Knowledge of General Education information will improve Description Including pain rating scale, medication(s)/side effects and non-pharmacologic comfort measures Outcome: Progressing   Problem: Health Behavior/Discharge Planning: Goal: Ability to manage health-related needs will improve Outcome: Progressing   Problem: Clinical Measurements: Goal: Ability to maintain clinical measurements within normal limits will improve Outcome: Progressing Goal: Will remain free from infection Outcome: Progressing Goal: Diagnostic test results will improve Outcome: Progressing Goal: Respiratory complications will improve Outcome: Progressing Goal: Cardiovascular complication will be avoided Outcome: Progressing   Problem: Activity: Goal: Risk for activity intolerance will decrease Outcome: Progressing   Problem: Nutrition: Goal: Adequate nutrition will be maintained Outcome: Progressing   Problem: Coping: Goal: Level of anxiety will decrease Outcome: Progressing   Problem: Elimination: Goal: Will not experience complications related to bowel motility Outcome: Progressing Goal: Will not experience complications related to urinary retention Outcome: Progressing   Problem: Pain Managment: Goal: General experience of comfort will improve Outcome: Progressing   Problem: Safety: Goal: Ability to remain free from injury will improve Outcome: Progressing   Problem: Skin Integrity: Goal: Risk for impaired skin integrity will decrease Outcome: Progressing   Problem: Education: Goal: Knowledge of disease or condition will improve Outcome: Progressing Goal: Knowledge of secondary prevention will improve Outcome: Progressing Goal: Knowledge of patient specific risk factors addressed and post discharge goals established will improve Outcome: Progressing   Problem: Self-Care: Goal: Ability to participate in self-care as condition permits will  improve Outcome: Progressing Goal: Verbalization of feelings and concerns over difficulty with self-care will improve Outcome: Progressing Goal: Ability to communicate needs accurately will improve Outcome: Progressing

## 2018-03-21 NOTE — Evaluation (Signed)
Physical Therapy Evaluation Patient Details Name: Angela Vargas MRN: [id number] DOB: Sep 18, 1921 Today's Date: 03/21/2018   History of Present Illness  presented to ER secondary to progressive L UE/LE weakness, L shoulder pain, multiple falls in home environment; admitted with acute CVA (MRI significant for R occipital lobe and R thalamic infarct) and L clavicle fracture (ortho consult pending)  Clinical Impression  Upon evaluation, patient alert and oriented to self, location and general month; follows commands and participates well with session.  L UE immobilized/NWB throughout session due to clavicle fracture (pending ortho consult).  STrength and ROM of remaining extremities grossly at least 4-/5, generally functional for basic transfers and mobility.  Denies sensory deficit, minimal pain reported (mild grimacing with flexion of L hip).  Currently requiring min/mod assist for bed mobility (mostly to protect L UE) and close sup for unsupported sitting balance. Mild L lateral lean with fatigue, but self-corrects with cuing.  Additional standing or OOB attempts deferred due to patient fatigue, L shoulder pain, reported dizziness (vitals stable).  Requested return to supine for rest.  Will continue mobility assessment and progression in subsequent sessions as appropriate. Would benefit from skilled PT to address above deficits and promote optimal return to PLOF;Recommend transition to HHPT upon discharge from acute hospitalization.     Follow Up Recommendations Home health PT;Supervision/Assistance - 24 hour    Equipment Recommendations  (will continue equipment assessment as OOB mobility attempted)    Recommendations for Other Services       Precautions / Restrictions Precautions Precautions: Fall Restrictions Weight Bearing Restrictions: Yes LUE Weight Bearing: Non weight bearing Other Position/Activity Restrictions: immobilized/protected throughout session      Mobility  Bed  Mobility Overal bed mobility: Needs Assistance Bed Mobility: Supine to Sit;Sit to Supine     Supine to sit: Min assist;Mod assist Sit to supine: Min assist;Mod assist   General bed mobility comments: assist to guide movement and protect L UE  Transfers                 General transfer comment: deferred due to patient fatigue, L shoulder pain, reported dizziness (vitals stable).  Requested return to supine for rest  Ambulation/Gait             General Gait Details: deferred due to patient fatigue, L shoulder pain, reported dizziness (vitals stable).  Requested return to supine for rest  Stairs            Wheelchair Mobility    Modified Rankin (Stroke Patients Only)       Balance Overall balance assessment: Needs assistance Sitting-balance support: No upper extremity supported;Feet supported Sitting balance-Leahy Scale: Fair Sitting balance - Comments: mild L lateral lean at times, corrects with min cuing       Standing balance comment: deferred due to patient fatigue, L shoulder pain, reported dizziness (vitals stable).  Requested return to supine for rest                             Pertinent Vitals/Pain Pain Assessment: Faces Faces Pain Scale: Hurts even more Pain Location: L shoulder Pain Descriptors / Indicators: Aching;Guarding;Grimacing Pain Intervention(s): Limited activity within patient's tolerance;Monitored during session;Repositioned    Home Living Family/patient expects to be discharged to:: Private residence Living Arrangements: Children(lives on ground level of apartment/hotel complex with family support, 24/7) Available Help at Discharge: Family;Available 24 hours/day   Home Access: Stairs to enter Entrance Stairs-Rails: None Entrance [employer]  of Steps: 1 Home Layout: One level        Prior Function Level of Independence: Needs assistance   Gait / Transfers Assistance Needed: Ambulatory for limited household  distances with RW, assist from family as needed; WC level for longer distances or out of house outings.  Does endorse multiple fall history.           Hand Dominance        Extremity/Trunk Assessment   Upper Extremity Assessment Upper Extremity Assessment: (L UE immobilized and NWB throughout evaluation due to clavicle fracture/pending ortho consult; R UE grossly WFL)    Lower Extremity Assessment Lower Extremity Assessment: (bilat LEs grossly at least 4-/5 throughout, easily moves throughout full range, all planes. Minimal pain reported to L hip.  Denies sensory deficit throughout)       Communication   Communication: (Prefers Hindu-based language; unable to successfully utilize interpreter services due to particular dialect of Hindu.  Son assisted as needed.)  Cognition Arousal/Alertness: Awake/alert Behavior During Therapy: WFL for tasks assessed/performed Overall Cognitive Status: History of cognitive impairments - at baseline                                 General Comments: oriented to self, location and month; follows simple commands; pleasant and cooperative, responds to/interacts well with family and therapist      General Comments      Exercises     Assessment/Plan    PT Assessment Patient needs continued PT services  PT Problem List Decreased strength;Decreased range of motion;Decreased activity tolerance;Decreased balance;Decreased mobility;Decreased coordination;Decreased cognition;Decreased knowledge of use of DME;Decreased safety awareness;Decreased knowledge of precautions;Pain       PT Treatment Interventions DME instruction;Gait training;Stair training;Functional mobility training;Therapeutic activities;Therapeutic exercise;Balance training;Neuromuscular re-education;Cognitive remediation;Patient/family education    PT Goals (Current goals can be found in the Care Plan section)  Acute Rehab PT Goals Patient Stated Goal: per family, to  take her home and resume care with family PT Goal Formulation: With patient/family Time For Goal Achievement: 04/04/18 Potential to Achieve Goals: Fair Additional Goals Additional Goal #1: Assess and establish goals for OOB transfers as medically appropriate.    Frequency 7X/week   Barriers to discharge        Co-evaluation               AM-PAC PT "6 Clicks" Mobility  Outcome Measure Help needed turning from your back to your side while in a flat bed without using bedrails?: A Little Help needed moving from lying on your back to sitting on the side of a flat bed without using bedrails?: A Lot Help needed moving to and from a bed to a chair (including a wheelchair)?: Total Help needed standing up from a chair using your arms (e.g., wheelchair or bedside chair)?: Total Help needed to walk in hospital room?: Total Help needed climbing 3-5 steps with a railing? : Total 6 Click Score: 9    End of Session   Activity Tolerance: Patient limited by fatigue;Patient limited by pain Patient left: in bed;with call bell/phone within reach;with bed alarm set;with family/visitor present Nurse Communication: Mobility status PT Visit Diagnosis: Muscle weakness (generalized) (M62.81);Difficulty in walking, not elsewhere classified (R26.2);Hemiplegia and hemiparesis Hemiplegia - Right/Left: Left Hemiplegia - dominant/non-dominant: Non-dominant Hemiplegia - caused by: Cerebral infarction    Time: 1035-1057 PT Time Calculation (min) (ACUTE ONLY): 22 min   Charges:   PT Evaluation $PT Eval Moderate  Complexity: 1 Mod          Nicodemus Denk H. Manson Passey, PT, DPT, NCS 03/21/18, 11:37 AM [phone]

## 2018-03-21 NOTE — Progress Notes (Signed)
OT Cancellation Note  Patient Details Name: Angela Vargas MRN: [id number] DOB: May 18, 1921   Cancelled Treatment:    Reason Eval/Treat Not Completed: Other (comment). Order received, chart reviewed. Pt noted with mildly displaced fracture of the distal clavicle. Orthopedic consult pending. Will hold OT evaluation at this time and re-attempt at later date/time pending ortho consult and updated POC with any weight bearing/UE precautions to adhere to.   Richrd PrimeJamie Stiller, MPH, MS, OTR/L ascom [phone] 03/21/18, 10:26 AM

## 2018-03-21 NOTE — Consult Note (Signed)
Consultation Note Date: 03/21/2018   Patient Name: Angela Vargas  DOB: September 11, 1921  MRN: [id number]  Age / Sex: 84 y.o., female  PCP: Patient, No Pcp Per Referring Physician: Demetrios Loll, MD  Reason for Consultation: Establishing goals of care  HPI/Patient Profile: 84 y.o. female  with past medical history of dementia, CAD, HTN, MI, pulmonary HTN admitted on 03/20/2018 with fall. Workup reveals clavicle fracture, acute infarct in the R occipital lobe and mid and lateral R thalmus. Palliative medicine consulted for [place].   Clinical Assessment and Goals of Care:  I have reviewed medical records including EPIC notes, labs and imaging, assessed the patient and then met at the bedside along with patient son  to discuss diagnosis prognosis, GOC, EOL wishes, disposition and options.  Patient in bed. Does not respond verbally to me- but speaks to her son in her native language. He states that she denies pain, and notes that she is seeing his sister at the door (who is not there).  I introduced Palliative Medicine as specialized medical care for people living with serious illness. It focuses on providing relief from the symptoms and stress of a serious illness. The goal is to improve quality of life for both the patient and the family.  We discussed a brief life review of the patient. She lives in the home with her son. Her spouse died several years ago from [place] under the care of Hospice in the home.  As far as functional and nutritional status - there have been significant declines in the three domains noted in the last several weeks. Cognitively she has been more and more confused, less verbal, agitated and sleepier. Functionally- she is ambulating less and frequently falling. Nutritionally- she is eating significantly less.    We discussed their current illness and what it means in the larger context of their  on-going co-morbidities.  Natural disease trajectory and expectations at EOL were discussed. Her son notes that although patient's chart states she is 73- she is actually 88. He states she has lived a long life in good health. We discussed her overall decline and the fact that recent CVA and falls indicate likely continued trajectory of decline and poor prognosis of return to prior functional status.  I attempted to elicit values and goals of care important to the patient. Mr. Verdejo states her spirituality and quality of life have always been important to her. She would prefer comfort and quality over time. A great amount of time was spend discussing her spiritual beliefs- she is Hindu and has focused living a good life in this life so she can be gifted a better life in her next life.   The difference between aggressive medical intervention and comfort care was considered in light of the patient's goals of care.   Advanced directives, concepts specific to code status, artifical feeding and hydration, and rehospitalization were considered and discussed.  Hospice and Palliative Care services outpatient were explained and offered.  Questions and concerns were addressed.  Hard Choices  booklet left for review. The family was encouraged to call with questions or concerns.   Primary Decision Maker NEXT OF KIN- Son- Rache Klimaszewski     SUMMARY OF RECOMMENDATIONS -D/C patient home with Hospice -Discussed that patient is likely to continue to decline with decreased po intake, mobilization- family would not want rehospitalization or artificial hydration or nutrition or other interventions if patient had imminent life threatening illness- they would prefer to keep her home and comfortable and support through natural dying process    Code Status/Advance Care Planning:  DNR  Additional Recommendations (Limitations, Scope, Preferences):  Avoid Hospitalization, Minimize Medications and Initiate Comfort  Feeding  Psycho-social/Spiritual:   Desire for further Chaplaincy support:no  Additional Recommendations: Education on Hospice  Prognosis:    < 6 months d/t advancing vascular dementia, recent CVA, decreased po intake, falls, decreased functional and cognitive status, family desire to take patient home with Hospice and keep home with comfort and quality for duration of life  Discharge Planning: Home with Hospice  Primary Diagnoses: Present on Admission: . Acute CVA (cerebrovascular accident) ([place])   I have reviewed the medical record, interviewed the patient and family, and examined the patient. The following aspects are pertinent.  Past Medical History:  Diagnosis Date  . CAD (coronary artery disease)   . Compression fracture of T12 vertebra (HCC)   . Diabetes mellitus without complication ([place])   . GERD (gastroesophageal reflux disease)   . Hypertension   . Knee osteoarthritis   . Myocardial infarct ([place])   . Pulmonary hypertension ([place])    Social History   Socioeconomic History  . Marital status: Widowed    Spouse name: Not on file  . Number of children: Not on file  . Years of education: Not on file  . Highest education level: Not on file  Occupational History  . Not on file  Social Needs  . Financial resource strain: Patient refused  . Food insecurity:    Worry: Patient refused    Inability: Patient refused  . Transportation needs:    Medical: Patient refused    Non-medical: Patient refused  Tobacco Use  . Smoking status: Never Smoker  . Smokeless tobacco: Never Used  Substance and Sexual Activity  . Alcohol use: No  . Drug use: No  . Sexual activity: Not Currently  Lifestyle  . Physical activity:    Days per week: Patient refused    Minutes per session: Patient refused  . Stress: Patient refused  Relationships  . Social connections:    Talks on phone: Patient refused    Gets together: Patient refused    Attends religious service: Patient refused     Active member of club or organization: Patient refused    Attends meetings of clubs or organizations: Patient refused    Relationship status: Patient refused  Other Topics Concern  . Not on file  Social History Narrative   Lives home with son and daughter in-law    Family History  Problem Relation Age of Onset  . Hypertension Father    Scheduled Meds: . aspirin EC  81 mg Oral Daily  . citalopram  10 mg Oral QHS  . clopidogrel  75 mg Oral Daily  . enoxaparin (LOVENOX) injection  30 mg Subcutaneous Q24H  . gabapentin  100 mg Oral TID  . insulin aspart  0-5 Units Subcutaneous QHS  . insulin aspart  0-9 Units Subcutaneous TID WC  . isosorbide mononitrate  20 mg Oral Daily  . losartan  50 mg Oral Daily  . metoprolol tartrate  12.5 mg Oral BID  . pantoprazole  40 mg Oral Daily  . polyethylene glycol  17 g Oral Daily   Continuous Infusions: . sodium chloride 50 mL/hr at 03/20/18 2052   PRN Meds:.acetaminophen **OR** acetaminophen (TYLENOL) oral liquid 160 mg/5 mL **OR** acetaminophen, senna-docusate Medications Prior to Admission:  Prior to Admission medications   Medication Sig Start Date End Date Taking? Authorizing Provider  aspirin EC 81 MG tablet Take 81 mg by mouth daily.   Yes [provider]  Calcium Carbonate-Vitamin D (CALCIUM 600+D) 600-200 MG-UNIT TABS Take 1 tablet by mouth daily.   Yes [provider]  Cranberry 1000 MG CAPS Take 1,000 mg by mouth daily.   Yes [provider]  cyanocobalamin (,VITAMIN B-12,) 1000 MCG/ML injection Inject 1,000 mcg into the muscle every 30 (thirty) days.   Yes [provider]  folic acid (FOLVITE) 1 MG tablet Take 1 mg by mouth daily.    Yes [provider]  gabapentin (NEURONTIN) 100 MG capsule Take 100 mg by mouth 3 (three) times daily.   Yes [provider]  isosorbide mononitrate (ISMO,MONOKET) 20 MG tablet Take 20 mg by mouth daily.    Yes [provider]  losartan  (COZAAR) 100 MG tablet Take 50 mg by mouth daily.    Yes [provider]  metFORMIN (GLUCOPHAGE) 500 MG tablet Take 500 mg by mouth 3 (three) times daily.    Yes [provider]  metoprolol tartrate (LOPRESSOR) 25 MG tablet Take 1 tablet (25 mg total) by mouth 2 (two) times daily. Patient taking differently: Take 12.5 mg by mouth 2 (two) times daily.  07/04/15  Yes Hower, Aaron Mose, MD  Multiple Vitamins-Minerals (MULTIVITAMIN WITH MINERALS) tablet Take 1 tablet by mouth daily.   Yes [provider]  omeprazole (PRILOSEC) 20 MG capsule Take 20 mg by mouth daily.   Yes [provider]  polyethylene glycol (MIRALAX / GLYCOLAX) packet Take 17 g by mouth daily.   Yes [provider]  acetaminophen (TYLENOL) 500 MG tablet Take 1-2 tablets (500-1,000 mg total) by mouth every 6 (six) hours as needed for moderate pain. Patient not taking: Reported on 09/24/2017 06/29/15   Vaughan Basta, MD  citalopram (CELEXA) 10 MG tablet Take 10 mg by mouth at bedtime.     [provider]  clopidogrel (PLAVIX) 75 MG tablet Take 75 mg by mouth daily.     [provider]  feeding supplement, ENSURE ENLIVE, (ENSURE ENLIVE) LIQD Take 237 mLs by mouth 2 (two) times daily between meals. Patient not taking: Reported on 09/24/2017 07/04/15   Hower, Aaron Mose, MD  ibuprofen (MOTRIN IB) 200 MG tablet Take 1 tablet (200 mg total) by mouth every 8 (eight) hours as needed for moderate pain or cramping. Patient not taking: Reported on 09/24/2017 06/29/15   Vaughan Basta, MD  lovastatin (MEVACOR) 20 MG tablet Take 20 mg by mouth at bedtime.     [provider]  nitrofurantoin, macrocrystal-monohydrate, (MACROBID) 100 MG capsule Take 100 mg by mouth every Monday, Wednesday, and Friday.     [provider]  ondansetron (ZOFRAN ODT) 4 MG disintegrating tablet Take 1 tablet (4 mg total) by mouth every 8 (eight) hours as needed for nausea or  vomiting. Patient not taking: Reported on 09/24/2017 06/15/15   Earleen Newport, MD  pioglitazone (ACTOS) 15 MG tablet Take 15 mg by mouth daily.     [provider]  traZODone (DESYREL) 50 MG tablet Take 1 tablet (50 mg total) by mouth at bedtime. Patient not taking: Reported on 03/20/2018 08/01/15   Harvest Dark, MD   Allergies  Allergen Reactions  . Sulfa Antibiotics     unknown   Review of Systems  Unable to perform ROS: Dementia    Physical Exam Vitals signs and nursing note reviewed.  Constitutional:      Appearance: She is normal weight.  Pulmonary:     Effort: Pulmonary effort is normal.  Skin:    General: Skin is warm.     Coloration: Skin is pale.  Neurological:     Mental Status: She is alert. She is disoriented.     Vital Signs: BP (!) 143/77 (BP Location: Right Arm)   Pulse 96   Temp 97.6 F (36.4 C) (Oral)   Resp 18   Ht 5' (1.524 m)   Wt 59.9 kg   SpO2 96%   BMI 25.78 kg/m  Pain Scale: 0-10   Pain Score: 0-No pain   SpO2: SpO2: 96 % O2 Device:SpO2: 96 % O2 Flow Rate: .   IO: Intake/output summary:   Intake/Output Summary (Last 24 hours) at 03/21/2018 1442 Last data filed at 03/21/2018 1346 Gross per 24 hour  Intake 480 ml  Output -  Net 480 ml    LBM: Last BM Date: 03/21/18 Baseline Weight: Weight: 59.9 kg Most recent weight: Weight: 59.9 kg     Palliative Assessment/Data: PPS: 30%   Flowsheet Rows     Most Recent Value  Intake Tab  Referral Department  Hospitalist  Unit at Time of Referral  ER  Date Notified  03/20/18  Palliative Care Type  New Palliative care  Reason for referral  Clarify Goals of Care  Date of Admission  03/20/18  # of days IP prior to Palliative referral  0  Clinical Assessment  Psychosocial & Spiritual Assessment  Palliative Care Outcomes      Thank you for this consult. Palliative medicine will continue to follow and assist as needed.   Time In: 1300 Time Out: 1500 Time Total: 120  minutes Prolonged services billed: yes Greater than 50%  of this time was spent counseling and coordinating care related to the above assessment and plan.  Signed by: Mariana Kaufman, AGNP-C Palliative Medicine    Please contact Palliative Medicine Team phone at [phone] for questions and concerns.  For individual provider: See Shea Evans

## 2018-03-21 NOTE — Progress Notes (Signed)
New [facility] for hospice at home [facility].  Patient is a 84 year old female [facility] with [facility].  Past medical history of HTN, dementia, MI, CAD, pulmonary HTN.   Patient is post palliative care consult.  Family choose to take patient home with hospice.  They are familiar with our services, as patient's husband died in [id number] with our services.  I spoke with patient's son, Conception ChancyGarman.  He is in agreement with hospice services.  No DME needs.  Patient currently has a hospital bed, wheelchair, rolling walker, cane and BSC.  Information faxed to referral intake.  Patient with probable discharge home tomorrow.  Norris CrossKara H. Marshall, RN [phone] [facility]

## 2018-03-21 NOTE — Consult Note (Signed)
ORTHOPAEDIC CONSULTATION  REQUESTING PHYSICIAN: Shaune Pollack, MD  Chief Complaint:   L shoulder pain  History of Present Illness: Patient has severe dementia and history obtained from family at bedside and review of prior records.   Angela Vargas is a 84 y.o. female who had a fall yesterday. She noted L shoulder and left hip pain.  She has significant pain with motion of her shoulder, and pain appears improved at rest.  Pain appears to be mostly over the anterior aspect of the shoulder about the [facility] joint. X-rays in the emergency department showed a left distal clavicle fracture.  Additionally, she was diagnosed with a CVA And is on aspirin and Plavix.  Past Medical History:  Diagnosis Date  . CAD (coronary artery disease)   . Compression fracture of T12 vertebra (HCC)   . Diabetes mellitus without complication (HCC)   . GERD (gastroesophageal reflux disease)   . Hypertension   . Knee osteoarthritis   . Myocardial infarct (HCC)   . Pulmonary hypertension (HCC)    Past Surgical History:  Procedure Laterality Date  . STENT PLACEMENT ILIAC ([facility] HX)     Social History   Socioeconomic History  . Marital status: Widowed    Spouse name: Not on file  . Number of children: Not on file  . Years of education: Not on file  . Highest education level: Not on file  Occupational History  . Not on file  Social Needs  . Financial resource strain: Patient refused  . Food insecurity:    Worry: Patient refused    Inability: Patient refused  . Transportation needs:    Medical: Patient refused    Non-medical: Patient refused  Tobacco Use  . Smoking status: Never Smoker  . Smokeless tobacco: Never Used  Substance and Sexual Activity  . Alcohol use: No  . Drug use: No  . Sexual activity: Not Currently  Lifestyle  . Physical activity:    Days per week: Patient refused    Minutes per session: Patient refused  . Stress:  Patient refused  Relationships  . Social connections:    Talks on phone: Patient refused    Gets together: Patient refused    Attends religious service: Patient refused    Active member of club or organization: Patient refused    Attends meetings of clubs or organizations: Patient refused    Relationship status: Patient refused  Other Topics Concern  . Not on file  Social History Narrative   Lives home with son and daughter in-law    Family History  Problem Relation Age of Onset  . Hypertension Father    Allergies  Allergen Reactions  . Sulfa Antibiotics     unknown   Prior to Admission medications   Medication Sig Start Date End Date Taking? Authorizing Provider  aspirin EC 81 MG tablet Take 81 mg by mouth daily.   Yes [provider]  Calcium Carbonate-Vitamin D (CALCIUM 600+D) 600-200 MG-UNIT TABS Take 1 tablet by mouth daily.   Yes [provider]  Cranberry 1000 MG CAPS Take 1,000 mg by mouth daily.   Yes [provider]  cyanocobalamin (,VITAMIN B-12,) 1000 MCG/ML injection Inject 1,000 mcg into the muscle every 30 (thirty) days.   Yes [provider]  folic acid (FOLVITE) 1 MG tablet Take 1 mg by mouth daily.    Yes [provider]  gabapentin (NEURONTIN) 100 MG capsule Take 100 mg by mouth 3 (three) times daily.   Yes [provider]  isosorbide mononitrate (ISMO,MONOKET) 20 MG tablet Take 20 mg by mouth daily.    Yes [provider]  losartan (COZAAR) 100 MG tablet Take 50 mg by mouth daily.    Yes [provider]  metFORMIN (GLUCOPHAGE) 500 MG tablet Take 500 mg by mouth 3 (three) times daily.    Yes [provider]  metoprolol tartrate (LOPRESSOR) 25 MG tablet Take 1 tablet (25 mg total) by mouth 2 (two) times daily. Patient taking differently: Take 12.5 mg by mouth 2 (two) times daily.  07/04/15  Yes Hower, Cletis Athens, MD  Multiple Vitamins-Minerals (MULTIVITAMIN WITH MINERALS) tablet Take 1  tablet by mouth daily.   Yes [provider]  omeprazole (PRILOSEC) 20 MG capsule Take 20 mg by mouth daily.   Yes [provider]  polyethylene glycol (MIRALAX / GLYCOLAX) packet Take 17 g by mouth daily.   Yes [provider]  acetaminophen (TYLENOL) 500 MG tablet Take 1-2 tablets (500-1,000 mg total) by mouth every 6 (six) hours as needed for moderate pain. Patient not taking: Reported on 09/24/2017 06/29/15   Altamese Dilling, MD  citalopram (CELEXA) 10 MG tablet Take 10 mg by mouth at bedtime.     [provider]  clopidogrel (PLAVIX) 75 MG tablet Take 75 mg by mouth daily.     [provider]  feeding supplement, ENSURE ENLIVE, (ENSURE ENLIVE) LIQD Take 237 mLs by mouth 2 (two) times daily between meals. Patient not taking: Reported on 09/24/2017 07/04/15   Hower, Cletis Athens, MD  ibuprofen (MOTRIN IB) 200 MG tablet Take 1 tablet (200 mg total) by mouth every 8 (eight) hours as needed for moderate pain or cramping. Patient not taking: Reported on 09/24/2017 06/29/15   Altamese Dilling, MD  lovastatin (MEVACOR) 20 MG tablet Take 20 mg by mouth at bedtime.     [provider]  nitrofurantoin, macrocrystal-monohydrate, (MACROBID) 100 MG capsule Take 100 mg by mouth every Monday, Wednesday, and Friday.     [provider]  ondansetron (ZOFRAN ODT) 4 MG disintegrating tablet Take 1 tablet (4 mg total) by mouth every 8 (eight) hours as needed for nausea or vomiting. Patient not taking: Reported on 09/24/2017 06/15/15   Emily Filbert, MD  pioglitazone (ACTOS) 15 MG tablet Take 15 mg by mouth daily.     [provider]  traZODone (DESYREL) 50 MG tablet Take 1 tablet (50 mg total) by mouth at bedtime. Patient not taking: Reported on 03/20/2018 08/01/15   Minna Antis, MD   Recent Labs    03/20/18 1209 03/21/18 0733  WBC 6.7  --   HGB 12.1  --   HCT 37.7  --   PLT 260  --   K 4.1 4.0  CL 97* 103  CO2 26 22   BUN 30* 20  CREATININE 1.46* 0.96  GLUCOSE 160* 132*  CALCIUM 8.8* 8.7*   Dg Ribs Unilateral W/chest Left  Result Date: 03/20/2018 CLINICAL DATA:  Left-sided rib pain after fall. EXAM: LEFT RIBS AND CHEST - 3+ VIEW COMPARISON:  Chest x-ray dated August 01, 2015. FINDINGS: Stable cardiomediastinal silhouette. Aortoiliac atherosclerotic vascular disease. Normal pulmonary vascularity. No focal consolidation, pleural effusion, or pneumothorax. Unchanged calcified granuloma in the left upper lobe. No rib fracture. Left distal clavicle fracture. IMPRESSION: 1. No rib fracture. Left distal clavicle fracture, better evaluated on left shoulder x-rays. 2.  No active cardiopulmonary disease. Electronically Signed   By: Obie Dredge M.D.   On: 03/20/2018 13:08  Ct Head Wo Contrast  Result Date: 03/20/2018 CLINICAL DATA:  Pain following fall. EXAM: CT HEAD WITHOUT CONTRAST TECHNIQUE: Contiguous axial images were obtained from the base of the skull through the vertex without intravenous contrast. COMPARISON:  March 31, 2017 FINDINGS: Brain: There is moderate diffuse atrophy. There is no intracranial mass, hemorrhage, extra-axial fluid collection, or midline shift. There is a focal area of decreased attenuation in the medial superior right occipital lobe, suspicious for acute infarct focally in this area. There is decreased attenuation involving the mid to lateral right thalamus, also concerning for recent and possibly acute infarct. Elsewhere, there is small vessel disease in the centra semiovale bilaterally which appear stable. Vascular: No hyperdense vessel. There is calcification in the distal left vertebral artery as well as in the carotid siphon regions bilaterally. Skull: The bony calvarium appears intact. Sinuses/Orbits: There is mucosal thickening in several ethmoid air cells. Other visualized paranasal sinuses are clear. Visualized orbits appear symmetric bilaterally. Other: Mastoid air cells are  clear. IMPRESSION: 1. Decreased attenuation in the medial superior right occipital lobe, suspicious for acute infarct. This finding is best appreciated on axial slice 16 series 2 and coronal slice 50 series 4. 2. Suspect recent and possibly acute infarct involving portions of the mid to lateral right thalamus. 3.  Atrophy with periventricular small vessel disease. 4.  No evident mass or hemorrhage. 5.  There are foci of arterial vascular calcification. 6.  Mucosal thickening in several ethmoid air cells. 2.  Stable atrophy with periventricular small vessel disease. Electronically Signed   By: Bretta Bang III M.D.   On: 03/20/2018 12:26   Mr Brain Wo Contrast  Result Date: 03/20/2018 CLINICAL DATA:  LEFT-sided weakness for 3 weeks, recurrent falls. Assess stroke. On aspirin and Plavix. History of dementia, hypertension, diabetes. EXAM: MRI HEAD WITHOUT CONTRAST TECHNIQUE: Axial and coronal diffusion weighted imaging. Patient could not tolerate further imaging and examination was aborted. COMPARISON:  CT HEAD March 20, 2018. FINDINGS: Moderately motion degraded examination. Brain: Confluent RIGHT mesial temporal occipital reduced diffusion with low ADC values. Subcentimeter reduced diffusion RIGHT thalamus with low ADC values. No midline shift or mass effect. No hydrocephalus. Vascular: Nondiagnostic assessment. Skull and upper cervical spine: No reduced diffusion to suggest suspicious bony lesions. Sinuses/Orbits: Nondiagnostic assessment. Other: Nondiagnostic assessment. IMPRESSION: 1. Limited motion degraded 2 sequence MRI head: Acute RIGHT thalamus and RIGHT temporal occipital/PCA territory infarcts. Electronically Signed   By: Awilda Metro M.D.   On: 03/20/2018 17:59   US Carotid Bilateral (at [facility] And Ap Only)  Result Date: 03/21/2018 CLINICAL DATA:  Stroke EXAM: BILATERAL CAROTID DUPLEX ULTRASOUND TECHNIQUE: Wallace Cullens scale imaging, color Doppler and duplex ultrasound were performed of  bilateral carotid and vertebral arteries in the neck. COMPARISON:  None. FINDINGS: Criteria: Quantification of carotid stenosis is based on velocity parameters that correlate the residual internal carotid diameter with NASCET-based stenosis levels, using the diameter of the distal internal carotid lumen as the denominator for stenosis measurement. The following velocity measurements were obtained: RIGHT ICA: 85 cm/sec CCA: 46 cm/sec SYSTOLIC ICA/CCA RATIO:  1.9 ECA: 112 cm/sec LEFT ICA: 81 cm/sec CCA: 83 cm/sec SYSTOLIC ICA/CCA RATIO:  1.0 ECA: 151 cm/sec RIGHT CAROTID ARTERY: Mild predominately smooth calcified plaque in the bulb. Low resistance internal carotid Doppler pattern. RIGHT VERTEBRAL ARTERY:  Antegrade. LEFT CAROTID ARTERY: There is focal calcified plaque in the lower common carotid artery. There is prominent irregular calcified plaque in the upper common carotid artery. Minimal calcified smooth plaque in the  bulb. Low resistance internal carotid Doppler pattern is preserved. The internal carotid is markedly tortuous. LEFT VERTEBRAL ARTERY:  Antegrade. IMPRESSION: Less than 50% stenosis in the right and left internal carotid arteries. There is prominent calcified plaque in the left common carotid artery. Vertebral arteries are antegrade bilaterally. Electronically Signed   By: Jolaine ClickArthur  Hoss M.D.   On: 03/21/2018 10:10   Dg Shoulder Left  Result Date: 03/20/2018 CLINICAL DATA:  Fall. EXAM: LEFT SHOULDER - 2+ VIEW COMPARISON:  None. FINDINGS: Mildly displaced, comminuted fracture of the distal clavicle. No dislocation. Glenohumeral and acromioclavicular joint spaces are preserved. Osteopenia. Soft tissues are unremarkable. IMPRESSION: 1. Comminuted, mildly displaced fracture of the distal clavicle. No dislocation. Electronically Signed   By: Obie DredgeWilliam T Derry M.D.   On: 03/20/2018 13:06   Dg Hip Unilat W Or W/o Pelvis 2-3 Views Left  Result Date: 03/20/2018 CLINICAL DATA:  Left hip pain after fall.  EXAM: DG HIP (WITH OR WITHOUT PELVIS) 2-3V LEFT COMPARISON:  CT abdomen pelvis dated July 27, 2015. FINDINGS: No acute fracture or dislocation. The hip joint spaces are relatively preserved. Pubic symphysis and sacroiliac joints are intact. Osteopenia. Amorphous calcification near the left greater trochanter. IMPRESSION: 1.  No acute osseous abnormality. 2. Left gluteal calcific tendinitis. Electronically Signed   By: Obie DredgeWilliam T Derry M.D.   On: 03/20/2018 13:12     Positive ROS: All other systems have been reviewed and were otherwise negative with the exception of those mentioned in the HPI and as above.  Physical Exam: BP 134/70 (BP Location: Right Arm)   Pulse 89   Temp 97.7 F (36.5 C) (Oral)   Resp 18   Ht 5' (1.524 m)   Wt 59.9 kg   SpO2 96%   BMI 25.78 kg/m  General:  Alert, no acute distress Psychiatric:  Patient is competent for consent with normal mood and affect   Cardiovascular:  No pedal edema, regular rate and rhythm Respiratory:  No wheezing, non-labored breathing GI:  Abdomen is soft and non-tender Skin:  No lesions in the area of chief complaint, no erythema Neurologic:  Sensation intact distally, CN grossly intact Lymphatic:  No axillary or cervical lymphadenopathy  Orthopedic Exam:  LUE: Limited ability to perform exam due to patient's mental status +rad pulse Sens appears intact to touch TTP over anterior shoulder Significant bruising over anterior and superior shoulder  X-rays:  As above: L distal clavicle fracture  Assessment/Plan: Peter CongoJeliben B Fairbairn is a 84 y.o. female with a L distal clavicle fracture   1. I discussed the various treatment options including both surgical and non-surgical management of her fracture with the patient's family.   We elected to proceed with nonsurgical management especially given the patient's mental status.   2. NWB on LUE 3. Maintain sling expect for hygiene 4. Please page with questions.    Signa KellSunny  Stoney   03/21/2018 6:17 PM

## 2018-03-21 NOTE — Progress Notes (Signed)
[employer] - [place] at [facility]   PATIENT NAME: Angela Vargas    MR#:  [id number]  DATE OF BIRTH:  1922/03/04  SUBJECTIVE:  CHIEF COMPLAINT:   Chief Complaint  Patient presents with  . Fall  . Shoulder Pain  . Hip Pain   The patient is demented. REVIEW OF SYSTEMS:  Review of Systems  Unable to perform ROS: Dementia    DRUG ALLERGIES:   Allergies  Allergen Reactions  . Sulfa Antibiotics     unknown   VITALS:  Blood pressure (!) 143/77, pulse 96, temperature 97.6 F (36.4 C), temperature source Oral, resp. rate 18, height 5' (1.524 m), weight 59.9 kg, SpO2 96 %. PHYSICAL EXAMINATION:  Physical Exam Constitutional:      General: She is not in acute distress. HENT:     Head: Normocephalic.  Eyes:     General: No scleral icterus.    Conjunctiva/sclera: Conjunctivae normal.  Neck:     Musculoskeletal: Neck supple.     Vascular: No JVD.     Trachea: No tracheal deviation.  Cardiovascular:     Rate and Rhythm: Normal rate and regular rhythm.     Heart sounds: Normal heart sounds. No murmur. No gallop.   Pulmonary:     Effort: Pulmonary effort is normal. No respiratory distress.     Breath sounds: Normal breath sounds. No wheezing or rales.  Abdominal:     General: Bowel sounds are normal. There is no distension.     Palpations: Abdomen is soft.     Tenderness: There is no abdominal tenderness. There is no rebound.  Musculoskeletal:        General: No tenderness.  Skin:    Findings: No erythema or rash.  Neurological:     Mental Status: She is alert.     Comments: Unable to exam.  Demented.    LABORATORY PANEL:  Female CBC Recent Labs  Lab 03/20/18 1209  WBC 6.7  HGB 12.1  HCT 37.7  PLT 260   ------------------------------------------------------------------------------------------------------------------ Chemistries  Recent Labs  Lab 03/20/18 1209 03/21/18 0733  NA 132* 137  K 4.1 4.0  CL 97* 103  CO2 26 22  GLUCOSE  160* 132*  BUN 30* 20  CREATININE 1.46* 0.96  CALCIUM 8.8* 8.7*  AST 24  --   ALT 17  --   ALKPHOS 56  --   BILITOT 0.8  --    RADIOLOGY:  Mr Brain Wo Contrast  Result Date: 03/20/2018 CLINICAL DATA:  LEFT-sided weakness for 3 weeks, recurrent falls. Assess stroke. On aspirin and Plavix. History of dementia, hypertension, diabetes. EXAM: MRI HEAD WITHOUT CONTRAST TECHNIQUE: Axial and coronal diffusion weighted imaging. Patient could not tolerate further imaging and examination was aborted. COMPARISON:  CT HEAD March 20, 2018. FINDINGS: Moderately motion degraded examination. Brain: Confluent RIGHT mesial temporal occipital reduced diffusion with low ADC values. Subcentimeter reduced diffusion RIGHT thalamus with low ADC values. No midline shift or mass effect. No hydrocephalus. Vascular: Nondiagnostic assessment. Skull and upper cervical spine: No reduced diffusion to suggest suspicious bony lesions. Sinuses/Orbits: Nondiagnostic assessment. Other: Nondiagnostic assessment. IMPRESSION: 1. Limited motion degraded 2 sequence MRI head: Acute RIGHT thalamus and RIGHT temporal occipital/PCA territory infarcts. Electronically Signed   By: Awilda Metro M.D.   On: 03/20/2018 17:59   US Carotid Bilateral (at [facility] And Ap Only)  Result Date: 03/21/2018 CLINICAL DATA:  Stroke EXAM: BILATERAL CAROTID DUPLEX ULTRASOUND TECHNIQUE: Wallace Cullens scale imaging, color Doppler and duplex ultrasound were  performed of bilateral carotid and vertebral arteries in the neck. COMPARISON:  None. FINDINGS: Criteria: Quantification of carotid stenosis is based on velocity parameters that correlate the residual internal carotid diameter with NASCET-based stenosis levels, using the diameter of the distal internal carotid lumen as the denominator for stenosis measurement. The following velocity measurements were obtained: RIGHT ICA: 85 cm/sec CCA: 46 cm/sec SYSTOLIC ICA/CCA RATIO:  1.9 ECA: 112 cm/sec LEFT ICA: 81 cm/sec CCA: 83  cm/sec SYSTOLIC ICA/CCA RATIO:  1.0 ECA: 151 cm/sec RIGHT CAROTID ARTERY: Mild predominately smooth calcified plaque in the bulb. Low resistance internal carotid Doppler pattern. RIGHT VERTEBRAL ARTERY:  Antegrade. LEFT CAROTID ARTERY: There is focal calcified plaque in the lower common carotid artery. There is prominent irregular calcified plaque in the upper common carotid artery. Minimal calcified smooth plaque in the bulb. Low resistance internal carotid Doppler pattern is preserved. The internal carotid is markedly tortuous. LEFT VERTEBRAL ARTERY:  Antegrade. IMPRESSION: Less than 50% stenosis in the right and left internal carotid arteries. There is prominent calcified plaque in the left common carotid artery. Vertebral arteries are antegrade bilaterally. Electronically Signed   By: Jolaine ClickArthur  Hoss M.D.   On: 03/21/2018 10:10   ASSESSMENT AND PLAN:   Acute CVA. Continue aspirin and Plavix, continue statin, echocardiograph is pending and carotid duplex is unremarkable.    Continue aspirin and the Plavix per neuro consult.  Acute renal failure and hyponatremia. On normal saline IV and follow-up BMP. Left clavicle fracture.  Pain control.  Follow-up orthopedic surgery consult.  Dr. Allena KatzPatel will see the patient tonight. Hypertension.  Continue home hypertension medication. Diabetes.  Hold diabetes medication, on sliding scale. CAD.  Continue aspirin, Plavix and statin. Dementia.  Aspiration and fall precaution. The patient has poor prognosis,  she will be discharged home with hospice care.  All the records are reviewed and case discussed with Care Management/Social Worker. Management plans discussed with the patient's son and they are in agreement.  CODE STATUS: DNR  TOTAL TIME TAKING CARE OF THIS PATIENT: 27 minutes.   More than 50% of the time was spent in counseling/coordination of care: YES  POSSIBLE D/C IN 1 DAYS, DEPENDING ON CLINICAL CONDITION.   Shaune PollackQing Divya Munshi M.D on 03/21/2018 at 3:09  PM  Between 7am to 6pm - Pager - [phone]  After 6pm go to [email or web address] - Therapist, nutritionalpassword EPAS [facility]  [facility]

## 2018-03-21 NOTE — Progress Notes (Signed)
Pt VS stable. Pt refused her 0400 VS/Neuro assessment. Pt confused and restless. Difficult for pt to express needs due to language barrier. Mobile translator attempted, pt unable to understand Tele-translator. Will continue to monitor.

## 2018-03-21 NOTE — Progress Notes (Addendum)
Subjective: Patient alert and oriented to self and place. She is however pleasantly confused and requires re-orientation per son who is currently at the bedside. She was not able to get up with PT today due to clavicle fracture and left hip pain. She was however able to sit up in bed with moderate assistance.  Objective: Current vital signs: BP (!) 143/77 (BP Location: Right Arm)   Pulse 96   Temp 97.6 F (36.4 C) (Oral)   Resp 18   Ht 5' (1.524 m)   Wt 59.9 kg   SpO2 96%   BMI 25.78 kg/m  Vital signs in last 24 hours: Temp:  [97.1 F (36.2 C)-98.9 F (37.2 C)] 97.6 F (36.4 C) (12/12 1216) Pulse Rate:  [73-96] 96 (12/12 1216) Resp:  [9-20] 18 (12/12 1216) BP: (143-180)/(56-127) 143/77 (12/12 1216) SpO2:  [95 %-98 %] 96 % (12/12 1216) Weight:  [59.9 kg] 59.9 kg (12/11 2316)  Intake/Output from previous day: No intake/output data recorded. Intake/Output this shift: Total I/O In: 480 [P.O.:480] Out: -  Nutritional status:  Diet Order            Diet heart healthy/carb modified Room service appropriate? Yes; Fluid consistency: Thin  Diet effective now             Neurologic Exam: Mental Status: Alert, oriented to person, place and situation. Was able to identify her son by name and relationship to her. Speech fluent without evidence of aphasia. Able to follow 3 step commands with some difficulty.  Attention span and concentration seemed mildly impaired, requires re-orientation to situation. Cranial Nerves: II: Discs flat bilaterally; Visual fields grossly normal, pupils equal, round, reactive to light and accommodation III,IV, VI: ptosis not present, extra-ocular motions intact bilaterally V,VII: smile symmetric, facial light touch sensationintact VIII: hearing normal bilaterally IX,X: gag reflex present XI: Shrug shoulder due to injury XII: midline tongue extension Motor: Right : Upper extremity 5/5 without pronator drift         Left:Unable to liftupper  extremity of the bed due to injury Right:Lower extremity 5/5Left: Able to break gravity 3/5 Tone and bulk:normal tone throughout; no atrophy noted Sensory: Pinprick and light touchintact bilaterally Deep Tendon Reflexes: 2+ and symmetric throughout Plantars: Right:muteLeft: mute Cerebellar: Unable to perform due to difficulty with comprehension and language barrier Gait: not tested due to safety concerns  Data Reviewed  Lab Results: Basic Metabolic Panel: Recent Labs  Lab 03/20/18 1209 03/21/18 0733  NA 132* 137  K 4.1 4.0  CL 97* 103  CO2 26 22  GLUCOSE 160* 132*  BUN 30* 20  CREATININE 1.46* 0.96  CALCIUM 8.8* 8.7*    Liver Function Tests: Recent Labs  Lab 03/20/18 1209  AST 24  ALT 17  ALKPHOS 56  BILITOT 0.8  PROT 7.2  ALBUMIN 3.8   No results for input(s): LIPASE, AMYLASE in the last 168 hours. No results for input(s): AMMONIA in the last 168 hours.  CBC: Recent Labs  Lab 03/20/18 1209  WBC 6.7  NEUTROABS 4.5  HGB 12.1  HCT 37.7  MCV 90.6  PLT 260    Cardiac Enzymes: Recent Labs  Lab 03/20/18 1209  TROPONINI <0.03    Lipid Panel: No results for input(s): CHOL, TRIG, HDL, CHOLHDL, VLDL, LDLCALC in the last 168 hours.  CBG: Recent Labs  Lab 03/20/18 2048 03/21/18 0803 03/21/18 1137  GLUCAP 135* 112* 142*    Microbiology: Results for orders placed or performed during the hospital encounter of 08/01/15  Urine culture     Status: None   Collection Time: 08/01/15  3:51 PM  Result Value Ref Range Status   Specimen Description URINE, CATHETERIZED  Final   Special Requests NONE  Final   Culture NO GROWTH 2 DAYS  Final   Report Status 08/03/2015 FINAL  Final    Coagulation Studies: No results for input(s): LABPROT, INR in the last 72 hours.  Imaging: Dg Ribs Unilateral W/chest Left  Result Date: 03/20/2018 CLINICAL DATA:  Left-sided rib pain after  fall. EXAM: LEFT RIBS AND CHEST - 3+ VIEW COMPARISON:  Chest x-ray dated August 01, 2015. FINDINGS: Stable cardiomediastinal silhouette. Aortoiliac atherosclerotic vascular disease. Normal pulmonary vascularity. No focal consolidation, pleural effusion, or pneumothorax. Unchanged calcified granuloma in the left upper lobe. No rib fracture. Left distal clavicle fracture. IMPRESSION: 1. No rib fracture. Left distal clavicle fracture, better evaluated on left shoulder x-rays. 2.  No active cardiopulmonary disease. Electronically Signed   By: Obie Dredge M.D.   On: 03/20/2018 13:08   Ct Head Wo Contrast  Result Date: 03/20/2018 CLINICAL DATA:  Pain following fall. EXAM: CT HEAD WITHOUT CONTRAST TECHNIQUE: Contiguous axial images were obtained from the base of the skull through the vertex without intravenous contrast. COMPARISON:  March 31, 2017 FINDINGS: Brain: There is moderate diffuse atrophy. There is no intracranial mass, hemorrhage, extra-axial fluid collection, or midline shift. There is a focal area of decreased attenuation in the medial superior right occipital lobe, suspicious for acute infarct focally in this area. There is decreased attenuation involving the mid to lateral right thalamus, also concerning for recent and possibly acute infarct. Elsewhere, there is small vessel disease in the centra semiovale bilaterally which appear stable. Vascular: No hyperdense vessel. There is calcification in the distal left vertebral artery as well as in the carotid siphon regions bilaterally. Skull: The bony calvarium appears intact. Sinuses/Orbits: There is mucosal thickening in several ethmoid air cells. Other visualized paranasal sinuses are clear. Visualized orbits appear symmetric bilaterally. Other: Mastoid air cells are clear. IMPRESSION: 1. Decreased attenuation in the medial superior right occipital lobe, suspicious for acute infarct. This finding is best appreciated on axial slice 16 series 2 and  coronal slice 50 series 4. 2. Suspect recent and possibly acute infarct involving portions of the mid to lateral right thalamus. 3.  Atrophy with periventricular small vessel disease. 4.  No evident mass or hemorrhage. 5.  There are foci of arterial vascular calcification. 6.  Mucosal thickening in several ethmoid air cells. 2.  Stable atrophy with periventricular small vessel disease. Electronically Signed   By: Bretta Bang III M.D.   On: 03/20/2018 12:26   Mr Brain Wo Contrast  Result Date: 03/20/2018 CLINICAL DATA:  LEFT-sided weakness for 3 weeks, recurrent falls. Assess stroke. On aspirin and Plavix. History of dementia, hypertension, diabetes. EXAM: MRI HEAD WITHOUT CONTRAST TECHNIQUE: Axial and coronal diffusion weighted imaging. Patient could not tolerate further imaging and examination was aborted. COMPARISON:  CT HEAD March 20, 2018. FINDINGS: Moderately motion degraded examination. Brain: Confluent RIGHT mesial temporal occipital reduced diffusion with low ADC values. Subcentimeter reduced diffusion RIGHT thalamus with low ADC values. No midline shift or mass effect. No hydrocephalus. Vascular: Nondiagnostic assessment. Skull and upper cervical spine: No reduced diffusion to suggest suspicious bony lesions. Sinuses/Orbits: Nondiagnostic assessment. Other: Nondiagnostic assessment. IMPRESSION: 1. Limited motion degraded 2 sequence MRI head: Acute RIGHT thalamus and RIGHT temporal occipital/PCA territory infarcts. Electronically Signed   By: Awilda Metro M.D.   On:  03/20/2018 17:59   US Carotid Bilateral (at [facility] Only)  Result Date: 03/21/2018 CLINICAL DATA:  Stroke EXAM: BILATERAL CAROTID DUPLEX ULTRASOUND TECHNIQUE: Wallace Cullens scale imaging, color Doppler and duplex ultrasound were performed of bilateral carotid and vertebral arteries in the neck. COMPARISON:  None. FINDINGS: Criteria: Quantification of carotid stenosis is based on velocity parameters that correlate the residual  internal carotid diameter with NASCET-based stenosis levels, using the diameter of the distal internal carotid lumen as the denominator for stenosis measurement. The following velocity measurements were obtained: RIGHT ICA: 85 cm/sec CCA: 46 cm/sec SYSTOLIC ICA/CCA RATIO:  1.9 ECA: 112 cm/sec LEFT ICA: 81 cm/sec CCA: 83 cm/sec SYSTOLIC ICA/CCA RATIO:  1.0 ECA: 151 cm/sec RIGHT CAROTID ARTERY: Mild predominately smooth calcified plaque in the bulb. Low resistance internal carotid Doppler pattern. RIGHT VERTEBRAL ARTERY:  Antegrade. LEFT CAROTID ARTERY: There is focal calcified plaque in the lower common carotid artery. There is prominent irregular calcified plaque in the upper common carotid artery. Minimal calcified smooth plaque in the bulb. Low resistance internal carotid Doppler pattern is preserved. The internal carotid is markedly tortuous. LEFT VERTEBRAL ARTERY:  Antegrade. IMPRESSION: Less than 50% stenosis in the right and left internal carotid arteries. There is prominent calcified plaque in the left common carotid artery. Vertebral arteries are antegrade bilaterally. Electronically Signed   By: Jolaine Click M.D.   On: 03/21/2018 10:10   Dg Shoulder Left  Result Date: 03/20/2018 CLINICAL DATA:  Fall. EXAM: LEFT SHOULDER - 2+ VIEW COMPARISON:  None. FINDINGS: Mildly displaced, comminuted fracture of the distal clavicle. No dislocation. Glenohumeral and acromioclavicular joint spaces are preserved. Osteopenia. Soft tissues are unremarkable. IMPRESSION: 1. Comminuted, mildly displaced fracture of the distal clavicle. No dislocation. Electronically Signed   By: Obie Dredge M.D.   On: 03/20/2018 13:06   Dg Hip Unilat W Or W/o Pelvis 2-3 Views Left  Result Date: 03/20/2018 CLINICAL DATA:  Left hip pain after fall. EXAM: DG HIP (WITH OR WITHOUT PELVIS) 2-3V LEFT COMPARISON:  CT abdomen pelvis dated July 27, 2015. FINDINGS: No acute fracture or dislocation. The hip joint spaces are relatively  preserved. Pubic symphysis and sacroiliac joints are intact. Osteopenia. Amorphous calcification near the left greater trochanter. IMPRESSION: 1.  No acute osseous abnormality. 2. Left gluteal calcific tendinitis. Electronically Signed   By: Obie Dredge M.D.   On: 03/20/2018 13:12    Medications:  I have reviewed the patient's current medications. Prior to Admission:  Medications Prior to Admission  Medication Sig Dispense Refill Last Dose  . aspirin EC 81 MG tablet Take 81 mg by mouth daily.   03/20/2018 at 0800  . Calcium Carbonate-Vitamin D (CALCIUM 600+D) 600-200 MG-UNIT TABS Take 1 tablet by mouth daily.   03/20/2018 at 0800  . Cranberry 1000 MG CAPS Take 1,000 mg by mouth daily.   03/20/2018 at 0800  . cyanocobalamin (,VITAMIN B-12,) 1000 MCG/ML injection Inject 1,000 mcg into the muscle every 30 (thirty) days.   Past Month at Unknown time  . folic acid (FOLVITE) 1 MG tablet Take 1 mg by mouth daily.   3 03/20/2018 at 0800  . gabapentin (NEURONTIN) 100 MG capsule Take 100 mg by mouth 3 (three) times daily.   03/20/2018 at 0800  . isosorbide mononitrate (ISMO,MONOKET) 20 MG tablet Take 20 mg by mouth daily.   3 03/20/2018 at 0800  . losartan (COZAAR) 100 MG tablet Take 50 mg by mouth daily.   3 03/20/2018 at 0800  . metFORMIN (GLUCOPHAGE) 500  MG tablet Take 500 mg by mouth 3 (three) times daily.   3 03/20/2018 at Unknown time  . metoprolol tartrate (LOPRESSOR) 25 MG tablet Take 1 tablet (25 mg total) by mouth 2 (two) times daily. (Patient taking differently: Take 12.5 mg by mouth 2 (two) times daily. ) 60 tablet 0 03/20/2018 at 0800  . Multiple Vitamins-Minerals (MULTIVITAMIN WITH MINERALS) tablet Take 1 tablet by mouth daily.   03/20/2018 at 0800  . omeprazole (PRILOSEC) 20 MG capsule Take 20 mg by mouth daily.   03/20/2018 at Unknown time  . polyethylene glycol (MIRALAX / GLYCOLAX) packet Take 17 g by mouth daily.   prn at prn  . acetaminophen (TYLENOL) 500 MG tablet Take 1-2 tablets  (500-1,000 mg total) by mouth every 6 (six) hours as needed for moderate pain. (Patient not taking: Reported on 09/24/2017) 30 tablet 0 Not Taking  . citalopram (CELEXA) 10 MG tablet Take 10 mg by mouth at bedtime.   3 Not Taking at Unknown time  . clopidogrel (PLAVIX) 75 MG tablet Take 75 mg by mouth daily.   3 Not Taking at Unknown time  . feeding supplement, ENSURE ENLIVE, (ENSURE ENLIVE) LIQD Take 237 mLs by mouth 2 (two) times daily between meals. (Patient not taking: Reported on 09/24/2017) 237 mL 12 Not Taking  . ibuprofen (MOTRIN IB) 200 MG tablet Take 1 tablet (200 mg total) by mouth every 8 (eight) hours as needed for moderate pain or cramping. (Patient not taking: Reported on 09/24/2017) 30 tablet 0 Not Taking  . lovastatin (MEVACOR) 20 MG tablet Take 20 mg by mouth at bedtime.   3 Not Taking at Unknown time  . nitrofurantoin, macrocrystal-monohydrate, (MACROBID) 100 MG capsule Take 100 mg by mouth every Monday, Wednesday, and Friday.    Not Taking at Unknown time  . ondansetron (ZOFRAN ODT) 4 MG disintegrating tablet Take 1 tablet (4 mg total) by mouth every 8 (eight) hours as needed for nausea or vomiting. (Patient not taking: Reported on 09/24/2017) 20 tablet 0 Not Taking  . pioglitazone (ACTOS) 15 MG tablet Take 15 mg by mouth daily.   2 Not Taking at Unknown time  . traZODone (DESYREL) 50 MG tablet Take 1 tablet (50 mg total) by mouth at bedtime. (Patient not taking: Reported on 03/20/2018) 30 tablet 0 Not Taking at Unknown time   Scheduled: . aspirin EC  81 mg Oral Daily  . citalopram  10 mg Oral QHS  . clopidogrel  75 mg Oral Daily  . enoxaparin (LOVENOX) injection  30 mg Subcutaneous Q24H  . gabapentin  100 mg Oral TID  . insulin aspart  0-5 Units Subcutaneous QHS  . insulin aspart  0-9 Units Subcutaneous TID WC  . isosorbide mononitrate  20 mg Oral Daily  . losartan  50 mg Oral Daily  . metoprolol tartrate  12.5 mg Oral BID  . pantoprazole  40 mg Oral Daily  . polyethylene  glycol  17 g Oral Daily   Patient seen and examined.  Clinical course and management discussed.  Necessary edits performed.  I agree with the above.  Assessment and plan of care developed and discussed below.    Assessment: 84 y.o. female with past medical history of coronary artery disease status post stenting, probable Alzheimer's disease with behavioral disturbances, diabetes mellitus, hypertension, MI, pulmonary hypertension, hyperlipidemia, and chronic anemia presenting  left shoulder and left hip pain and weakness status post fall. Found to have communicated mildly displaced fracture of the distal clavicle. MRI brain  reviewed and show acute right thalamus and right temporal occipital/PCA territory infarcts. Etiology likely embolic. US carotids did not show hemodynamically significant stenosis. Echocardiogram did not show cardiac source of emboli.  Plan: 1. HgbA1c, fasting lipid panel pending 2. PT consult, OT consult, Speech consult 3. Continuemedical management with dual therapy Aspirin 81 mg/day and Plavix 75 mg /day 4. Depending how aggressive family wants to be would consider prolonged cardiac monitoring for detection of possible occult afib.     This patient was staffed with Dr. Verlon Au, Thad Ranger who personally evaluated patient, reviewed documentation and agreed with assessment and plan of care as above.  Webb Silversmith, DNP, FNP-BC Board certified Nurse Practitioner Neurology Department    LOS: 1 day   03/21/2018  3:13 PM  Thana Farr, MD Neurology [phone]  03/21/2018  3:38 PM

## 2018-03-21 NOTE — Progress Notes (Signed)
SLP Cancellation Note  Patient Details Name: Angela Vargas MRN: [id number] DOB: 07/19/1921   Cancelled treatment:       Reason Eval/Treat Not Completed: SLP screened, no needs identified, will sign off(chart reviewed; consulted NSG then met w/ Son/pt). Unable to obtain an Interpreter for pt's native language; she also has advanced Dementia per chart/Son report. Son and MDs have reported pt is alert and able to conversive w/ family adequately and follow basic, general commands. Son denied any speech deficits. Briefly discussed general aspiration precautions to include less use of straws if pt does not use straws at baseline; Son agreed.  No further skilled ST services indicated as pt appears close to/at her baseline in her communication abilities. Recommended f/u w/ Neurologist/PCP post discharge home if any new decline in abilities noted. Son agreed.     Orinda Kenner, [place], CCC-SLP Watson,Katherine 03/21/2018, 12:23 PM

## 2018-03-22 LAB — GLUCOSE, CAPILLARY: Glucose-Capillary: 129 mg/dL — ABNORMAL HIGH (ref 70–99)

## 2018-03-22 MED ORDER — DIPHENHYDRAMINE HCL 50 MG/ML IJ SOLN
12.5000 mg | Freq: Once | INTRAMUSCULAR | Status: AC
  Administered 2018-03-22: 06:00:00 12.5 mg via INTRAVENOUS
  Filled 2018-03-22: qty 0.25

## 2018-03-22 NOTE — Discharge Summary (Signed)
[facility] [place] [facility]   PATIENT NAME: Angela Vargas    MR#:  [id number]  DATE OF BIRTH:  Sep 07, 1921  DATE OF ADMISSION:  03/20/2018   ADMITTING PHYSICIAN: Shaune Pollack, MD  DATE OF DISCHARGE: 03/22/2018  1:07 PM  PRIMARY CARE PHYSICIAN: Patient, No Pcp Per   ADMISSION DIAGNOSIS:  Fall, initial encounter [W19.XXXA] Cerebrovascular accident (CVA), unspecified mechanism (HCC) [I63.9] Closed displaced fracture of left clavicle, unspecified part of clavicle, initial encounter [S42.002A] DISCHARGE DIAGNOSIS:  Active Problems:   Acute CVA (cerebrovascular accident) (HCC)   Closed displaced fracture of left clavicle   Advanced care planning/counseling discussion   Vascular dementia with behavior disturbance (HCC)   Goals of care, counseling/discussion   Palliative care by specialist  SECONDARY DIAGNOSIS:   Past Medical History:  Diagnosis Date  . CAD (coronary artery disease)   . Compression fracture of T12 vertebra (HCC)   . Diabetes mellitus without complication (HCC)   . GERD (gastroesophageal reflux disease)   . Hypertension   . Knee osteoarthritis   . Myocardial infarct (HCC)   . Pulmonary hypertension (HCC)    HOSPITAL COURSE:   Acute CVA. Continue aspirin and Plavix, continue statin, echocardiograph is unremarkable and carotid duplex is unremarkable.   Continue aspirin and the Plavix per neuro consult.  Acute renal failure and hyponatremia.   She was treated with normal saline IV.  Left clavicle fracture. Pain control.   Arm sling per Dr. Allena Katz.  Hypertension. Continue home hypertension medication. Diabetes. Hold diabetes medication, on sliding scale.  Resume home medication after discharge. CAD. Continue aspirin, Plavix and statin. Dementia. Aspiration and fall precaution. The patient has poor prognosis, she will be discharged home with hospice care. DISCHARGE CONDITIONS:  Stable, discharged to home with hospice care  today. CONSULTS OBTAINED:  Treatment Team:  Kym Groom, MD Signa Kell, MD DRUG ALLERGIES:   Allergies  Allergen Reactions  . Sulfa Antibiotics     unknown   DISCHARGE MEDICATIONS:   Allergies as of 03/22/2018      Reactions   Sulfa Antibiotics    unknown      Medication List    STOP taking these medications   lovastatin 20 MG tablet Commonly known as:  MEVACOR     TAKE these medications   acetaminophen 500 MG tablet Commonly known as:  TYLENOL Take 1-2 tablets (500-1,000 mg total) by mouth every 6 (six) hours as needed for moderate pain.   aspirin EC 81 MG tablet Take 81 mg by mouth daily.   CALCIUM 600+D 600-200 MG-UNIT Tabs Generic drug:  Calcium Carbonate-Vitamin D Take 1 tablet by mouth daily.   citalopram 10 MG tablet Commonly known as:  CELEXA Take 10 mg by mouth at bedtime.   clopidogrel 75 MG tablet Commonly known as:  PLAVIX Take 75 mg by mouth daily.   Cranberry 1000 MG Caps Take 1,000 mg by mouth daily.   cyanocobalamin 1000 MCG/ML injection Commonly known as:  (VITAMIN B-12) Inject 1,000 mcg into the muscle every 30 (thirty) days.   feeding supplement (ENSURE ENLIVE) Liqd Take 237 mLs by mouth 2 (two) times daily between meals.   folic acid 1 MG tablet Commonly known as:  FOLVITE Take 1 mg by mouth daily.   gabapentin 100 MG capsule Commonly known as:  NEURONTIN Take 100 mg by mouth 3 (three) times daily.   ibuprofen 200 MG tablet Commonly known as:  MOTRIN IB Take 1 tablet (200 mg total) by mouth every  8 (eight) hours as needed for moderate pain or cramping.   isosorbide mononitrate 20 MG tablet Commonly known as:  ISMO,MONOKET Take 20 mg by mouth daily.   losartan 100 MG tablet Commonly known as:  COZAAR Take 50 mg by mouth daily.   metFORMIN 500 MG tablet Commonly known as:  GLUCOPHAGE Take 500 mg by mouth 3 (three) times daily.   metoprolol tartrate 25 MG tablet Commonly known as:  LOPRESSOR Take 1 tablet (25  mg total) by mouth 2 (two) times daily. What changed:  how much to take   multivitamin with minerals tablet Take 1 tablet by mouth daily.   nitrofurantoin (macrocrystal-monohydrate) 100 MG capsule Commonly known as:  MACROBID Take 100 mg by mouth every Monday, Wednesday, and Friday.   omeprazole 20 MG capsule Commonly known as:  PRILOSEC Take 20 mg by mouth daily.   ondansetron 4 MG disintegrating tablet Commonly known as:  ZOFRAN ODT Take 1 tablet (4 mg total) by mouth every 8 (eight) hours as needed for nausea or vomiting.   pioglitazone 15 MG tablet Commonly known as:  ACTOS Take 15 mg by mouth daily.   polyethylene glycol packet Commonly known as:  MIRALAX / GLYCOLAX Take 17 g by mouth daily.   traZODone 50 MG tablet Commonly known as:  DESYREL Take 1 tablet (50 mg total) by mouth at bedtime.        DISCHARGE INSTRUCTIONS:  See AVS.  If you experience worsening of your admission symptoms, develop shortness of breath, life threatening emergency, suicidal or homicidal thoughts you must seek medical attention immediately by calling 911 or calling your MD immediately  if symptoms less severe.  You Must read complete instructions/literature along with all the possible adverse reactions/side effects for all the Medicines you take and that have been prescribed to you. Take any new Medicines after you have completely understood and accpet all the possible adverse reactions/side effects.   Please note  You were cared for by a hospitalist during your hospital stay. If you have any questions about your discharge medications or the care you received while you were in the hospital after you are discharged, you can call the unit and asked to speak with the hospitalist on call if the hospitalist that took care of you is not available. Once you are discharged, your primary care physician will handle any further medical issues. Please note that NO REFILLS for any discharge medications  will be authorized once you are discharged, as it is imperative that you return to your primary care physician (or establish a relationship with a primary care physician if you do not have one) for your aftercare needs so that they can reassess your need for medications and monitor your lab values.    On the day of Discharge:  VITAL SIGNS:  Blood pressure (!) 157/85, pulse 74, temperature 98 F (36.7 C), resp. rate 18, height 5' (1.524 m), weight 59.9 kg, SpO2 99 %. PHYSICAL EXAMINATION:  GENERAL:  84 y.o.-year-old patient lying in the bed with no acute distress.  EYES: Pupils equal, round, reactive to light and accommodation. No scleral icterus. Extraocular muscles intact.  HEENT: Head atraumatic, normocephalic.  NECK:  Supple, no jugular venous distention. No thyroid enlargement, no tenderness.  LUNGS: Normal breath sounds bilaterally, no wheezing, rales,rhonchi or crepitation. No use of accessory muscles of respiration.  CARDIOVASCULAR: S1, S2 normal. No murmurs, rubs, or gallops.  ABDOMEN: Soft, non-tender, non-distended. Bowel sounds present. No organomegaly or mass.  EXTREMITIES: No  pedal edema, cyanosis, or clubbing.  NEUROLOGIC: Unable to exam. PSYCHIATRIC: The patient is demented. SKIN: No obvious rash, lesion, or ulcer.  DATA REVIEW:   CBC Recent Labs  Lab 03/20/18 1209  WBC 6.7  HGB 12.1  HCT 37.7  PLT 260    Chemistries  Recent Labs  Lab 03/20/18 1209 03/21/18 0733  NA 132* 137  K 4.1 4.0  CL 97* 103  CO2 26 22  GLUCOSE 160* 132*  BUN 30* 20  CREATININE 1.46* 0.96  CALCIUM 8.8* 8.7*  AST 24  --   ALT 17  --   ALKPHOS 56  --   BILITOT 0.8  --      Microbiology Results  Results for orders placed or performed during the hospital encounter of 08/01/15  Urine culture     Status: None   Collection Time: 08/01/15  3:51 PM  Result Value Ref Range Status   Specimen Description URINE, CATHETERIZED  Final   Special Requests NONE  Final   Culture NO GROWTH 2  DAYS  Final   Report Status 08/03/2015 FINAL  Final    RADIOLOGY:  No results found.   Management plans discussed with the patient, her son and they are in agreement.  CODE STATUS: Prior   TOTAL TIME TAKING CARE OF THIS PATIENT: 36 minutes.    Shaune Pollack M.D on 03/22/2018 at 6:45 PM  Between 7am to 6pm - Pager - [phone]  After 6pm go to [email or web address] - Social research officer, government  [facility]  Office  [phone]  CC: Primary care physician; Patient, No Pcp Per   Note: This dictation was prepared with Dragon dictation along with smaller phrase technology. Any transcriptional errors that result from this process are unintentional.

## 2018-03-22 NOTE — Progress Notes (Signed)
Pt being discharged home with hospice, discharge instructions reviewed with son, states understanding, pt with no complaints, EMS called for transport

## 2018-03-22 NOTE — Care Management (Signed)
Discharge to home today per Dr. Imogene Burnhen. Will be followed by [facility]. Transportation will be arranged per [facility]. Gwenette GreetBrenda S Camden Knotek RN MSN CCM Care Management [phone]

## 2018-03-22 NOTE — Discharge Instructions (Signed)
Hospice care at home. °

## 2018-03-22 NOTE — Progress Notes (Signed)
OT Cancellation Note  Patient Details Name: Peter CongoJeliben B Montalban MRN: [id number] DOB: [birth year]   Cancelled Treatment:    Reason Eval/Treat Not Completed: Other (comment)(Nursing reports pt. is d/cing home with Hospice, and EMS has been called. OT services are not indicated at this time.) Will sign off.  Olegario MessierElaine Cadie Sorci, MS, OTR/L 03/22/2018, 11:32 AM

## 2018-07-10 DEATH — deceased
